# Patient Record
Sex: Female | Born: 1950 | Race: Black or African American | Hispanic: No | Marital: Married | Smoking: Never smoker
Health system: Southern US, Community
[De-identification: ages and names within clinical notes are randomized; demographics above are authoritative.]

## PROBLEM LIST (undated history)

## (undated) HISTORY — PX: ABDOMINAL HYSTERECTOMY: SHX81

---

## 2000-12-19 ENCOUNTER — Encounter: Payer: Self-pay | Admitting: Family Medicine

## 2005-10-06 ENCOUNTER — Ambulatory Visit: Payer: Self-pay | Admitting: Family Medicine

## 2005-10-28 ENCOUNTER — Encounter: Payer: Self-pay | Admitting: Family Medicine

## 2005-10-28 LAB — CONVERTED CEMR LAB
LDL Cholesterol: 87 mg/dL
Triglyceride fasting, serum: 57 mg/dL

## 2006-10-09 ENCOUNTER — Encounter: Payer: Self-pay | Admitting: Family Medicine

## 2006-10-10 ENCOUNTER — Ambulatory Visit: Payer: Self-pay | Admitting: Family Medicine

## 2006-11-29 ENCOUNTER — Encounter: Payer: Self-pay | Admitting: Family Medicine

## 2006-12-01 ENCOUNTER — Telehealth (INDEPENDENT_AMBULATORY_CARE_PROVIDER_SITE_OTHER): Payer: Self-pay | Admitting: *Deleted

## 2006-12-01 LAB — CONVERTED CEMR LAB
AST: 24 units/L (ref 0–37)
Albumin: 4.5 g/dL (ref 3.5–5.2)
Alkaline Phosphatase: 55 units/L (ref 39–117)
Bilirubin, Direct: 0.1 mg/dL (ref 0.0–0.3)
Total Bilirubin: 0.3 mg/dL (ref 0.3–1.2)

## 2006-12-05 ENCOUNTER — Encounter: Payer: Self-pay | Admitting: Family Medicine

## 2006-12-25 ENCOUNTER — Ambulatory Visit: Payer: Self-pay | Admitting: Family Medicine

## 2006-12-25 DIAGNOSIS — I10 Essential (primary) hypertension: Secondary | ICD-10-CM | POA: Insufficient documentation

## 2006-12-25 DIAGNOSIS — B351 Tinea unguium: Secondary | ICD-10-CM | POA: Insufficient documentation

## 2007-01-18 ENCOUNTER — Encounter: Payer: Self-pay | Admitting: Family Medicine

## 2008-02-27 ENCOUNTER — Ambulatory Visit: Payer: Self-pay | Admitting: Family Medicine

## 2008-02-29 ENCOUNTER — Encounter: Admission: RE | Admit: 2008-02-29 | Discharge: 2008-02-29 | Payer: Self-pay | Admitting: Family Medicine

## 2008-02-29 ENCOUNTER — Encounter: Payer: Self-pay | Admitting: Family Medicine

## 2008-03-03 LAB — CONVERTED CEMR LAB
ALT: 25 units/L (ref 0–35)
CO2: 22 meq/L (ref 19–32)
Calcium: 9.2 mg/dL (ref 8.4–10.5)
Chloride: 102 meq/L (ref 96–112)
Cholesterol: 191 mg/dL (ref 0–200)
Glucose, Bld: 96 mg/dL (ref 70–99)
Sodium: 138 meq/L (ref 135–145)
Total Bilirubin: 0.5 mg/dL (ref 0.3–1.2)
Total Protein: 7.2 g/dL (ref 6.0–8.3)
Triglycerides: 88 mg/dL (ref <150)
VLDL: 18 mg/dL (ref 0–40)

## 2008-03-04 ENCOUNTER — Encounter: Payer: Self-pay | Admitting: Family Medicine

## 2009-07-23 ENCOUNTER — Ambulatory Visit: Payer: Self-pay | Admitting: Family Medicine

## 2009-07-23 ENCOUNTER — Encounter: Payer: Self-pay | Admitting: Family Medicine

## 2009-07-23 ENCOUNTER — Other Ambulatory Visit: Admission: RE | Admit: 2009-07-23 | Discharge: 2009-07-23 | Payer: Self-pay | Admitting: Family Medicine

## 2009-07-23 DIAGNOSIS — K219 Gastro-esophageal reflux disease without esophagitis: Secondary | ICD-10-CM | POA: Insufficient documentation

## 2009-07-24 ENCOUNTER — Encounter: Payer: Self-pay | Admitting: Family Medicine

## 2009-07-27 ENCOUNTER — Encounter: Admission: RE | Admit: 2009-07-27 | Discharge: 2009-07-27 | Payer: Self-pay | Admitting: Family Medicine

## 2009-07-27 ENCOUNTER — Encounter: Payer: Self-pay | Admitting: Family Medicine

## 2009-07-27 LAB — CONVERTED CEMR LAB
ALT: 20 units/L (ref 0–35)
AST: 24 units/L (ref 0–37)
Albumin: 4.3 g/dL (ref 3.5–5.2)
BUN: 13 mg/dL (ref 6–23)
CO2: 26 meq/L (ref 19–32)
Calcium: 9.6 mg/dL (ref 8.4–10.5)
Chloride: 102 meq/L (ref 96–112)
Cholesterol: 215 mg/dL — ABNORMAL HIGH (ref 0–200)
Creatinine, Ser: 0.99 mg/dL (ref 0.40–1.20)
Glucose, Bld: 94 mg/dL (ref 70–99)
TSH: 1.457 microintl units/mL (ref 0.350–4.500)
Total Bilirubin: 0.5 mg/dL (ref 0.3–1.2)
Total Protein: 7.3 g/dL (ref 6.0–8.3)

## 2009-07-28 ENCOUNTER — Encounter: Admission: RE | Admit: 2009-07-28 | Discharge: 2009-07-28 | Payer: Self-pay | Admitting: Family Medicine

## 2009-07-29 LAB — HM MAMMOGRAPHY: HM Mammogram: NORMAL

## 2009-09-16 ENCOUNTER — Ambulatory Visit: Payer: Self-pay | Admitting: Family Medicine

## 2009-09-16 DIAGNOSIS — M542 Cervicalgia: Secondary | ICD-10-CM

## 2009-09-16 DIAGNOSIS — S335XXA Sprain of ligaments of lumbar spine, initial encounter: Secondary | ICD-10-CM

## 2009-09-23 ENCOUNTER — Encounter: Payer: Self-pay | Admitting: Family Medicine

## 2009-10-05 ENCOUNTER — Encounter: Admission: RE | Admit: 2009-10-05 | Discharge: 2009-11-10 | Payer: Self-pay | Admitting: Family Medicine

## 2009-10-05 ENCOUNTER — Encounter: Payer: Self-pay | Admitting: Family Medicine

## 2009-10-29 ENCOUNTER — Encounter: Payer: Self-pay | Admitting: Family Medicine

## 2010-06-18 ENCOUNTER — Ambulatory Visit: Payer: Self-pay | Admitting: Family Medicine

## 2010-06-18 DIAGNOSIS — K529 Noninfective gastroenteritis and colitis, unspecified: Secondary | ICD-10-CM

## 2010-06-19 ENCOUNTER — Encounter: Payer: Self-pay | Admitting: Family Medicine

## 2010-06-22 ENCOUNTER — Encounter: Payer: Self-pay | Admitting: Family Medicine

## 2010-06-22 ENCOUNTER — Telehealth: Payer: Self-pay | Admitting: Family Medicine

## 2010-06-22 LAB — CONVERTED CEMR LAB
Albumin: 4.8 g/dL (ref 3.5–5.2)
Alkaline Phosphatase: 55 units/L (ref 39–117)
BUN: 12 mg/dL (ref 6–23)
Basophils Absolute: 0 10*3/uL (ref 0.0–0.1)
Calcium: 10.4 mg/dL (ref 8.4–10.5)
Chloride: 97 meq/L (ref 96–112)
Creatinine, Ser: 0.88 mg/dL (ref 0.40–1.20)
Eosinophils Absolute: 0.1 10*3/uL (ref 0.0–0.7)
Eosinophils Relative: 1 % (ref 0–5)
Glucose, Bld: 60 mg/dL — ABNORMAL LOW (ref 70–99)
HCT: 37.1 % (ref 36.0–46.0)
Hemoglobin: 12.5 g/dL (ref 12.0–15.0)
Lymphocytes Relative: 35 % (ref 12–46)
MCV: 92.1 fL (ref 78.0–100.0)
Monocytes Absolute: 0.6 10*3/uL (ref 0.1–1.0)
Platelets: 399 10*3/uL (ref 150–400)
Potassium: 4 meq/L (ref 3.5–5.3)
RDW: 13.7 % (ref 11.5–15.5)

## 2010-06-28 ENCOUNTER — Telehealth (INDEPENDENT_AMBULATORY_CARE_PROVIDER_SITE_OTHER): Payer: Self-pay | Admitting: *Deleted

## 2010-07-05 ENCOUNTER — Ambulatory Visit: Payer: Self-pay | Admitting: Family Medicine

## 2010-07-05 DIAGNOSIS — R1013 Epigastric pain: Secondary | ICD-10-CM

## 2010-07-09 ENCOUNTER — Encounter: Payer: Self-pay | Admitting: Family Medicine

## 2010-07-28 LAB — HM COLONOSCOPY

## 2010-07-30 ENCOUNTER — Encounter: Payer: Self-pay | Admitting: Family Medicine

## 2010-07-31 ENCOUNTER — Encounter: Payer: Self-pay | Admitting: Family Medicine

## 2011-01-18 NOTE — Letter (Signed)
Summary: Letter with Path Results/[facility]  Letter with Path Results/[facility]   Imported By: Lanelle Bal 08/11/2010 10:39:36  _____________________________________________________________________  External Attachment:    Type:   Image     Comment:   External Document

## 2011-01-18 NOTE — Progress Notes (Signed)
Summary: needs a Rx.   Phone Note Call from Patient   Caller: Mom Summary of Call: pt came in and states that she was seen by Dr.Tommie Dejoseph last week and was given a Rx. for Cipro and dropped it in the toilet.. Please send another Rx. to her pharmacy.... [facility] in [place] on [address]  Call patient at [phone] Initial call taken by: Michaelle Copas,  June 22, 2010 11:51 AM    Prescriptions: CIPRO 500 MG TABS (CIPROFLOXACIN HCL) 1 tab by mouth q 12 hrs x 5 days  #10 x 0   Entered and Authorized by:   Seymour Bars DO   Signed by:   Seymour Bars DO on 06/22/2010   Method used:   Electronically to        [facility] [address] [phone]* (retail)       [address]       [place], [place]  [address]       Ph: [phone]       Fax: [phone]   RxID:   [id number]   Appended Document: needs a Rx.  pt aware rx efilled to [facility]. KIK

## 2011-01-18 NOTE — Progress Notes (Signed)
  Prescriptions: PROTONIX 40 MG TBEC (PANTOPRAZOLE SODIUM) 1 tab by mouth daily  #30 x 1   Entered by:   Payton Spark CMA   Authorized by:   Seymour Bars DO   Signed by:   Payton Spark CMA on 06/28/2010   Method used:   Electronically to        [facility] [address] [phone]* (retail)       [address]       [place], [place]  [address]       Ph: [phone]       Fax: [phone]   RxID:   [id number]

## 2011-01-18 NOTE — Assessment & Plan Note (Signed)
Summary: N/V/D   Vital Signs:  Patient profile:   60 year old female Menstrual status:  hysterectomy Height:      68.75 inches Weight:      158 pounds BMI:     23.59 Temp:     98.4 degrees F oral Pulse rate:   79 / minute BP sitting:   118 / 72  (left arm) Cuff size:   regular  Vitals Entered By: Kathlene November (June 18, 2010 10:45 AM) CC: yesterday vomited 3 times everytime she ate. Last week diarrhea- today feels better but has heartburn and abdominal bloating   Primary Care Provider:  Nani Gasser MD  CC:  yesterday vomited 3 times everytime she ate. Last week diarrhea- today feels better but has heartburn and abdominal bloating.  History of Present Illness: 60 yo AAF presents for vomitting and diarrhea that started 7 days ago.  She used Pepto and Immodium.  It started with vomitting at the start and at the end.  Feeling better today.  Has not seen any blood in her stools.  No sick contacts.  No fevers or chills.  Trying to hydrate.  She does feel hungry but has vomitting or diarrhea right after.  She is crampy and bloated.  Getting a lot of heartburn.  Having epigastric pain.  No new medications.  No recent antibiotics or traveling.  Current Medications (verified): 1)  Hydrochlorothiazide 25 Mg Tabs (Hydrochlorothiazide) .... One By Mouth Daily  Allergies (verified): No Known Drug Allergies  Past History:  Past Medical History: Reviewed history from 09/26/2006 and no changes required. _  Past Surgical History: Reviewed history from 09/26/2006 and no changes required. Partial Hysterectomy  1989  Family History: Reviewed history from 10/09/2006 and no changes required. alcoholism in brother and uncle MI in mother, GF  Social History: Reviewed history from 07/23/2009 and no changes required. 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  Walks occ for exercise.  2-3 glasses wine per day, no drugs, 1 caffeinated drink per  day.  Review of Systems      See HPI  Physical Exam  General:  alert, well-developed, well-nourished, and well-hydrated.   Head:  normocephalic and atraumatic.   Eyes:  sclera non icteric Mouth:  pharynx pink and moist.   Neck:  no masses.   Lungs:  Normal respiratory effort, chest expands symmetrically. Lungs are clear to auscultation, no crackles or wheezes. Heart:  Normal rate and regular rhythm. S1 and S2 normal without gallop, murmur, click, rub or other extra sounds. Abdomen:  Bowel sounds positive,abdomen soft and non-tender without masses, organomegaly  Extremities:  no LE edema Skin:  color normal.   Cervical Nodes:  No lymphadenopathy noted Psych:  good eye contact, not anxious appearing, and not depressed appearing.     Impression & Recommendations:  Problem # 1:  COLITIS ENTERIT&GASTROENTERIT INF ORIGIN (ICD-009.1) 7 days of N/V/D.  Hemodynamically stable, fairly well hydrated w/ normal exam findings.  Will get labs due to prolonged symptoms.  Will empirically treat for infectious colitis with 7 days of Cipro.  Treat hearturn with samples of Protonix x 10 days.  Cut HCTZ in 1/2 to prevent dehyration until V/D clears.  Clear fluids, advancing to [employer]  Check stool cx.  Call if not improved in 5-7 days.  Use Pepto Bismol for symptoms. Orders: T-CBC w/Diff ([id number]) T-Comprehensive Metabolic Panel [phone]) T-Stool Culture ([id number])  Complete Medication List: 1)  Hydrochlorothiazide 25 Mg Tabs (Hydrochlorothiazide) .Marland KitchenMarland KitchenMarland Kitchen  One by mouth daily 2)  Cipro 500 Mg Tabs (Ciprofloxacin hcl) .Marland Kitchen.. 1 tab by mouth q 12 hrs x 5 days 3)  Protonix 40 Mg Tbec (Pantoprazole sodium) .Marland Kitchen.. 1 tab by mouth daily  Patient Instructions: 1)  Take Cipro 1 pill every 12 hrs x 5 days for presumed infectious colitis. 2)  Use PEPTO BISMOL for remaining diarrhea/ nausea symptoms. 3)  Stick to a bland diet, clear fluids mostly until symptoms resolve.  Avoid milk products for the next 2  wks. 4)  Use Protonix for heartburn 1 tab daily until samples run out. 5)  Labs today. 6)  Will call you w/ results on Tuesday. 7)  If not resolved in 7 days, pls call. Prescriptions: CIPRO 500 MG TABS (CIPROFLOXACIN HCL) 1 tab by mouth q 12 hrs x 5 days  #10 x 0   Entered and Authorized by:   Seymour Bars DO   Signed by:   Seymour Bars DO on 06/18/2010   Method used:   Electronically to        [facility] [address] [phone]* (retail)       [address]       [place], [place]  [address]       Ph: [phone]       Fax: [phone]   RxID:   [id number]

## 2011-01-18 NOTE — Consult Note (Signed)
Summary: [facility]  [facility]   Imported By: Lanelle Bal 07/15/2010 11:15:50  _____________________________________________________________________  External Attachment:    Type:   Image     Comment:   External Document

## 2011-01-18 NOTE — Miscellaneous (Signed)
Summary: EGD/ colonoscopy  Clinical Lists Changes  Observations: Added new observation of EGD: [facility] Findings: Normal   (07/28/2010 14:43) Added new observation of COLONRECACT: Further recommendations pending biopsy results.   (07/28/2010 14:42) Added new observation of COLONOSCOPY: Location:  [facility].    Polyps in the sigmoid colon (polypectomy) polyp in the rectum (polypectomy) Internal hemorrhoids Diverticulosis of the Sigmoid Colon O/w normal  (07/28/2010 14:42)      Colonoscopy  Procedure date:  07/28/2010  Findings:      Location:  [facility].    Polyps in the sigmoid colon (polypectomy) polyp in the rectum (polypectomy) Internal hemorrhoids Diverticulosis of the Sigmoid Colon O/w normal   Comments:      Further recommendations pending biopsy results.    EGD  Procedure date:  07/28/2010  Findings:      [facility] Findings: Normal     Colonoscopy  Procedure date:  07/28/2010  Findings:      Location:  [facility].    Polyps in the sigmoid colon (polypectomy) polyp in the rectum (polypectomy) Internal hemorrhoids Diverticulosis of the Sigmoid Colon O/w normal   Comments:      Further recommendations pending biopsy results.    EGD  Procedure date:  07/28/2010  Findings:      [facility] Findings: Normal

## 2011-01-18 NOTE — Assessment & Plan Note (Signed)
Summary: epigastric pain   Vital Signs:  Patient profile:   60 year old female Menstrual status:  hysterectomy Height:      68.75 inches Weight:      159 pounds BMI:     23.74 O2 Sat:      99 % on Room air Pulse rate:   79 / minute BP sitting:   126 / 71  (left arm) Cuff size:   regular  Vitals Entered By: Payton Spark CMA (July 05, 2010 9:18 AM)  O2 Flow:  Room air CC: F/U. Feeling better but still having tenderness.   Primary Care Provider:  Nani Gasser MD  CC:  F/U. Feeling better but still having tenderness.Marland Kitchen  History of Present Illness: 60 yo AAF presents for f/u infectious colitis from 7/1 after having 7 days of N/V/D.  We went ahead and treated her for infectious colitis with Cipro for 7 days.  She is also taking Protonix daily for dyspepsia.  She is about 80% better.  Has some mild epigastric tenderness, not worse after eating, constant.  Her appetite is back to normal.    Her stool cx's were neg.  Denies melena, hematochezia, constipation or diarrhea.  Denies NSAID or ETOH use.  Had H. Pylori treated last year. CBC and CMP were normal.    She has not had a colonoscopy.   Current Medications (verified): 1)  Hydrochlorothiazide 25 Mg Tabs (Hydrochlorothiazide) .... One By Mouth Daily 2)  Protonix 40 Mg Tbec (Pantoprazole Sodium) .Marland Kitchen.. 1 Tab By Mouth Daily  Allergies (verified): No Known Drug Allergies  Past History:  Past Medical History: Reviewed history from 09/26/2006 and no changes required. _  Past Surgical History: Reviewed history from 09/26/2006 and no changes required. Partial Hysterectomy  1989  Social History: Reviewed history from 07/23/2009 and no changes required. 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  Walks occ for exercise.  2-3 glasses wine per day, no drugs, 1 caffeinated drink per day.  Review of Systems      See HPI  Physical Exam  General:  alert, well-developed, well-nourished, and  well-hydrated.   Head:  normocephalic and atraumatic.   Eyes:  sclera non icteric; wears glasses Mouth:  pharynx pink and moist.   Neck:  no masses.   Lungs:  Normal respiratory effort, chest expands symmetrically. Lungs are clear to auscultation, no crackles or wheezes. Heart:  Normal rate and regular rhythm. S1 and S2 normal without gallop, murmur, click, rub or other extra sounds. Abdomen:  slight epigastric TTP w/o vol guarding.  soft, no distention, no hepatomegaly, and no splenomegaly.   Extremities:  no LE edema Skin:  color normal.  no jaundice or pallor Cervical Nodes:  No lymphadenopathy noted Psych:  good eye contact, not anxious appearing, and not depressed appearing.     Impression & Recommendations:  Problem # 1:  ABDOMINAL PAIN, EPIGASTRIC (ICD-789.06) N/V/D did improve after treatment for prolonged gastroenteritis with 7 days of Cipro.  Her labs and stool cx were normal.  She has improved but still has epigastric tenderness which is constant, even on Protonix daily.  Will continue reflux precautions as well as daily use of Protonix and will refer to [facility] for eval and tx -- needs EGD + colonoscopy.  Call if any worsening of pain or blood in the stool.   Orders: Gastroenterology Referral (GI)  Problem # 2:  HYPERTENSION (ICD-401.1)  No longer dehydrated.  Back on full dose  HCTZ and BP looks great. Her updated medication list for this problem includes:    Hydrochlorothiazide 25 Mg Tabs (Hydrochlorothiazide) ..... One by mouth daily  BP today: 126/71 Prior BP: 118/72 (06/18/2010)  Prior 10 Yr Risk Heart Disease: 4 % (02/27/2008)  Labs Reviewed: K+: 4.0 (06/19/2010) Creat: : 0.88 (06/19/2010)   Chol: 215 (07/24/2009)   HDL: 98 (07/24/2009)   LDL: 101 (07/24/2009)   TG: 80 (07/24/2009)  Complete Medication List: 1)  Hydrochlorothiazide 25 Mg Tabs (Hydrochlorothiazide) .... One by mouth daily 2)  Protonix 40 Mg Tbec (Pantoprazole sodium) .Marland Kitchen.. 1  tab by mouth daily  Patient Instructions: 1)  Stay on Protonix. 2)  Avoid any anti- inflammatories or Alcohol. 3)  Will get you into [facility].

## 2011-07-12 ENCOUNTER — Other Ambulatory Visit: Payer: Self-pay | Admitting: Family Medicine

## 2011-07-15 ENCOUNTER — Other Ambulatory Visit: Payer: Self-pay | Admitting: Family Medicine

## 2011-07-15 NOTE — Telephone Encounter (Signed)
Pt needs office visit within 30 days for her BP.

## 2011-08-17 ENCOUNTER — Other Ambulatory Visit: Payer: Self-pay | Admitting: Family Medicine

## 2011-10-13 ENCOUNTER — Encounter: Payer: Self-pay | Admitting: Emergency Medicine

## 2011-10-13 ENCOUNTER — Telehealth: Payer: Self-pay | Admitting: Family Medicine

## 2011-10-13 ENCOUNTER — Ambulatory Visit
Admission: RE | Admit: 2011-10-13 | Discharge: 2011-10-13 | Disposition: A | Discharge status: Home / Self Care | Payer: BC Managed Care – PPO | Source: Ambulatory Visit | Attending: Emergency Medicine | Admitting: Emergency Medicine

## 2011-10-13 ENCOUNTER — Inpatient Hospital Stay (INDEPENDENT_AMBULATORY_CARE_PROVIDER_SITE_OTHER)
Admission: RE | Admit: 2011-10-13 | Discharge: 2011-10-13 | Disposition: A | Discharge status: Home / Self Care | Payer: BC Managed Care – PPO | Source: Ambulatory Visit | Attending: Emergency Medicine | Admitting: Emergency Medicine

## 2011-10-13 ENCOUNTER — Other Ambulatory Visit: Payer: Self-pay | Admitting: Emergency Medicine

## 2011-10-13 DIAGNOSIS — M545 Low back pain, unspecified: Secondary | ICD-10-CM

## 2011-10-13 DIAGNOSIS — M48061 Spinal stenosis, lumbar region without neurogenic claudication: Secondary | ICD-10-CM
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
## 2011-10-16 ENCOUNTER — Encounter: Payer: Self-pay | Admitting: Family Medicine

## 2011-10-17 ENCOUNTER — Encounter: Payer: Self-pay | Admitting: Family Medicine

## 2011-10-17 ENCOUNTER — Ambulatory Visit (INDEPENDENT_AMBULATORY_CARE_PROVIDER_SITE_OTHER): Payer: BC Managed Care – PPO | Admitting: Family Medicine

## 2011-10-17 VITALS — BP 140/71 | HR 89 | Ht 69.5 in | Wt 162.0 lb

## 2011-10-17 DIAGNOSIS — E785 Hyperlipidemia, unspecified: Secondary | ICD-10-CM

## 2011-10-17 DIAGNOSIS — M545 Low back pain: Secondary | ICD-10-CM

## 2011-10-17 DIAGNOSIS — Z2911 Encounter for prophylactic immunotherapy for respiratory syncytial virus (RSV): Secondary | ICD-10-CM

## 2011-10-17 DIAGNOSIS — I10 Essential (primary) hypertension: Secondary | ICD-10-CM

## 2011-10-17 DIAGNOSIS — Z1231 Encounter for screening mammogram for malignant neoplasm of breast: Secondary | ICD-10-CM

## 2011-10-17 DIAGNOSIS — Z23 Encounter for immunization: Secondary | ICD-10-CM
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
## 2011-10-17 NOTE — Patient Instructions (Signed)
Google the DASH diet (.http://www.myers.net/).  PDF that you have to download.    1.5 Gram Low Sodium Diet A 1.5 gram sodium diet restricts the amount of sodium in the diet to no more than 1.5 g or 1500 mg daily. The American Heart Association recommends Americans over the age of 46 to consume no more than 1500 mg of sodium each day to reduce the risk of developing high blood pressure. Research also shows that limiting sodium may reduce heart attack and stroke risk. Many foods contain sodium for flavor and sometimes as a preservative. When the amount of sodium in a diet needs to be low, it is important to know what to look for when choosing foods and drinks. The following includes some information and guidelines to help make it easier for you to adapt to a low sodium diet. QUICK TIPS  Do not add salt to food.     Avoid convenience items and fast food.     Choose unsalted snack foods.     Buy lower sodium products, often labeled as "lower sodium" or "no salt added."     Check food labels to learn how much sodium is in 1 serving.     When eating at a restaurant, ask that your food be prepared with less salt or none, if possible.  READING FOOD LABELS FOR SODIUM INFORMATION The nutrition facts label is a good place to find how much sodium is in foods. Look for products with no more than 400 mg of sodium per serving. Remember that 1.5 g = 1500 mg. The food label may also list foods as:  Sodium-free: Less than 5 mg in a serving.     Very low sodium: 35 mg or less in a serving.     Low-sodium: 140 mg or less in a serving.     Light in sodium: 50% less sodium in a serving. For example, if a food that usually has 300 mg of sodium is changed to become light in sodium, it will have 150 mg of sodium.     Reduced sodium: 25% less sodium in a serving. For example, if a food that usually has 400 mg of sodium is changed to reduced sodium, it will have 300 mg of sodium.  CHOOSING FOODS Grains  Avoid: Salted  crackers and snack items. Some cereals, including instant hot cereals. Bread stuffing and biscuit mixes. Seasoned rice or pasta mixes.     Choose: Unsalted snack items. Low-sodium cereals, oats, puffed wheat and rice, shredded wheat. English muffins and bread. Pasta.  Meats  Avoid: Salted, canned, smoked, spiced, pickled meats, including fish and poultry. Bacon, ham, sausage, cold cuts, hot dogs, anchovies.     Choose: Low-sodium canned tuna and salmon. Fresh or frozen meat, poultry, and fish.  Dairy  Avoid: Processed cheese and spreads. Cottage cheese. Buttermilk and condensed milk. Regular cheese.     Choose: Milk. Low-sodium cottage cheese. Yogurt. Sour cream. Low-sodium cheese.  Fruits and Vegetables  Avoid: Regular canned vegetables. Regular canned tomato sauce and paste. Frozen vegetables in sauces. Olives. Rosita Fire. Relishes. Sauerkraut.     Choose: Low-sodium canned vegetables. Low-sodium tomato sauce and paste. Frozen or fresh vegetables. Fresh and frozen fruit.  Condiments  Avoid: Canned and packaged gravies. Worcestershire sauce. Tartar sauce. Barbecue sauce. Soy sauce. Steak sauce. Ketchup. Onion, garlic, and table salt. Meat flavorings and tenderizers.     Choose: Fresh and dried herbs and spices. Low-sodium varieties of mustard and ketchup. Lemon juice. Tabasco sauce.  Horseradish.  SAMPLE 1.5 GRAM SODIUM MEAL PLAN Breakfast / Sodium (mg)  1 cup low-fat milk / 143 mg     1 whole-wheat English muffin / 240 mg     1 tbs heart-healthy margarine / 153 mg     1 hard-boiled egg / 139 mg     1 small orange / 0 mg  Lunch / Sodium (mg)  1 cup raw carrots / 76 mg     2 tbs no salt added peanut butter / 5 mg     2 slices whole-wheat bread / 270 mg     1 tbs jelly / 6 mg      cup red grapes / 2 mg  Dinner / Sodium (mg)  1 cup whole-wheat pasta / 2 mg     1 cup low-sodium tomato sauce / 73 mg     3 oz lean ground beef / 57 mg     1 small side salad (1 cup raw  spinach leaves,  cup cucumber,  cup yellow bell pepper) with 1 tsp olive oil and 1 tsp red wine vinegar / 25 mg  Snack / Sodium (mg)  1 container low-fat vanilla yogurt / 107 mg     3 graham cracker squares / 127 mg  Nutrient Analysis  Calories: 1745     Protein: 75 g     Carbohydrate: 237 g     Fat: 57 g     Sodium: 1425 mg  Document Released: 12/05/2005 Document Revised: 08/17/2011 Document Reviewed: 03/08/2010 [facility] Patient Information 2012 [place], [place].

## 2011-10-18 ENCOUNTER — Ambulatory Visit: Payer: BC Managed Care – PPO

## 2011-10-19 ENCOUNTER — Ambulatory Visit: Payer: BC Managed Care – PPO | Attending: Emergency Medicine | Admitting: Physical Therapy

## 2011-10-19 DIAGNOSIS — M2550 Pain in unspecified joint: Secondary | ICD-10-CM | POA: Insufficient documentation

## 2011-10-19 DIAGNOSIS — M6281 Muscle weakness (generalized): Secondary | ICD-10-CM | POA: Insufficient documentation

## 2011-10-19 DIAGNOSIS — IMO0001 Reserved for inherently not codable concepts without codable children: Secondary | ICD-10-CM | POA: Insufficient documentation

## 2011-10-19 DIAGNOSIS — R293 Abnormal posture: Secondary | ICD-10-CM | POA: Insufficient documentation

## 2011-10-24 ENCOUNTER — Ambulatory Visit: Payer: BC Managed Care – PPO | Attending: Emergency Medicine | Admitting: Physical Therapy

## 2011-10-24 DIAGNOSIS — IMO0001 Reserved for inherently not codable concepts without codable children: Secondary | ICD-10-CM | POA: Insufficient documentation

## 2011-10-24 DIAGNOSIS — M6281 Muscle weakness (generalized): Secondary | ICD-10-CM | POA: Insufficient documentation

## 2011-10-24 DIAGNOSIS — M2550 Pain in unspecified joint: Secondary | ICD-10-CM | POA: Insufficient documentation

## 2011-10-24 DIAGNOSIS — R293 Abnormal posture: Secondary | ICD-10-CM | POA: Insufficient documentation

## 2011-10-25 ENCOUNTER — Ambulatory Visit
Admission: RE | Admit: 2011-10-25 | Discharge: 2011-10-25 | Disposition: A | Discharge status: Home / Self Care | Payer: BC Managed Care – PPO | Source: Ambulatory Visit | Attending: Family Medicine | Admitting: Family Medicine

## 2011-10-25 DIAGNOSIS — Z1231 Encounter for screening mammogram for malignant neoplasm of breast: Secondary | ICD-10-CM

## 2011-10-27 ENCOUNTER — Ambulatory Visit: Payer: BC Managed Care – PPO | Admitting: Physical Therapy

## 2011-10-31 ENCOUNTER — Ambulatory Visit: Payer: BC Managed Care – PPO | Admitting: Physical Therapy

## 2011-11-03 ENCOUNTER — Encounter: Payer: BC Managed Care – PPO | Admitting: Physical Therapy

## 2011-11-07 ENCOUNTER — Ambulatory Visit: Payer: BC Managed Care – PPO | Admitting: Physical Therapy

## 2011-11-09 ENCOUNTER — Ambulatory Visit: Payer: BC Managed Care – PPO | Admitting: Physical Therapy

## 2011-11-14 ENCOUNTER — Ambulatory Visit: Payer: BC Managed Care – PPO | Admitting: Physical Therapy

## 2011-11-17 ENCOUNTER — Ambulatory Visit: Payer: BC Managed Care – PPO | Admitting: Physical Therapy

## 2011-11-21 ENCOUNTER — Other Ambulatory Visit: Payer: Self-pay | Admitting: Family Medicine
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
## 2012-03-02 ENCOUNTER — Other Ambulatory Visit: Payer: Self-pay | Admitting: Family Medicine

## 2012-04-30 ENCOUNTER — Other Ambulatory Visit: Payer: Self-pay | Admitting: Family Medicine

## 2012-05-29 ENCOUNTER — Other Ambulatory Visit: Payer: Self-pay | Admitting: Family Medicine

## 2012-06-25 ENCOUNTER — Other Ambulatory Visit: Payer: Self-pay | Admitting: Family Medicine

## 2012-06-25 NOTE — Telephone Encounter (Signed)
Needs appt before future refills

## 2012-07-26 ENCOUNTER — Other Ambulatory Visit: Payer: Self-pay | Admitting: Family Medicine

## 2012-07-30 ENCOUNTER — Ambulatory Visit: Payer: BC Managed Care – PPO | Admitting: Family Medicine

## 2012-08-03 ENCOUNTER — Ambulatory Visit (INDEPENDENT_AMBULATORY_CARE_PROVIDER_SITE_OTHER): Payer: BC Managed Care – PPO | Admitting: Family Medicine

## 2012-08-03 ENCOUNTER — Encounter: Payer: Self-pay | Admitting: Family Medicine

## 2012-08-03 VITALS — BP 150/77 | HR 84 | Ht 69.5 in | Wt 158.0 lb

## 2012-08-03 DIAGNOSIS — I10 Essential (primary) hypertension: Secondary | ICD-10-CM

## 2012-08-03 DIAGNOSIS — M545 Low back pain, unspecified: Secondary | ICD-10-CM

## 2012-08-03 DIAGNOSIS — E785 Hyperlipidemia, unspecified: Secondary | ICD-10-CM

## 2012-08-03 MED ORDER — TRAMADOL HCL 50 MG PO TABS: 50.0000 mg | ORAL_TABLET | Freq: Three times a day (TID) | ORAL | Status: DC | PRN

## 2012-08-03 MED ORDER — HYDROCHLOROTHIAZIDE 25 MG PO TABS: 25.0000 mg | ORAL_TABLET | Freq: Every day | ORAL | Status: DC

## 2012-08-03 NOTE — Progress Notes (Signed)
  Subjective:    Patient ID: Carolyn Lane, female    DOB: 06-04-1951, 61 y.o.   MRN: [id number]  HPI HTN - Taking meds regulalry. Has lost 4 lbs. Eatting a low salt diet. Not as physically. Doesn't check BP at home. Has been a year since had labs.  Ran out of BP med today. No CP or SOB.    Review of Systems     Objective:   Physical Exam  Constitutional: She is oriented to person, place, and time. She appears well-developed and well-nourished.  HENT:  Head: Normocephalic and atraumatic.  Cardiovascular: Normal rate, regular rhythm and normal heart sounds.   Pulmonary/Chest: Effort normal and breath sounds normal.  Neurological: She is alert and oriented to person, place, and time.  Skin: Skin is warm and dry.  Psychiatric: She has a normal mood and affect. Her behavior is normal.          Assessment & Plan:  HTN- Recheck in one week to confirm.  IF still high ocnsider adding an ACEi. Due for CMP and fasting lipid panel. Continue work on exercise and low salt diet.  Hyperlipidemia - Due to recheck.   Chronic-recurrent low back pain-I. did refill her tramadol today.

## 2012-08-03 NOTE — Patient Instructions (Addendum)
Black Cohash, evening primerose oil Increase soy in your diet.   Valerian root capsules to help with sleep.

## 2012-08-06 LAB — COMPLETE METABOLIC PANEL WITH GFR
ALT: 20 U/L (ref 0–35)
AST: 23 U/L (ref 0–37)
Creat: 0.92 mg/dL (ref 0.50–1.10)
Sodium: 139 mEq/L (ref 135–145)
Total Bilirubin: 0.5 mg/dL (ref 0.3–1.2)

## 2012-08-06 LAB — LIPID PANEL
HDL: 100 mg/dL (ref >39)
LDL Cholesterol: 113 mg/dL — ABNORMAL HIGH (ref 0–99)
Total CHOL/HDL Ratio: 2.3 Ratio
Triglycerides: 104 mg/dL (ref <150)
VLDL: 21 mg/dL (ref 0–40)

## 2012-08-10 ENCOUNTER — Ambulatory Visit (INDEPENDENT_AMBULATORY_CARE_PROVIDER_SITE_OTHER): Payer: BC Managed Care – PPO | Admitting: Family Medicine

## 2012-08-10 VITALS — BP 134/80 | HR 74

## 2012-08-10 DIAGNOSIS — I10 Essential (primary) hypertension: Secondary | ICD-10-CM

## 2012-08-10 NOTE — Progress Notes (Addendum)
  Subjective:    Patient ID: Carolyn Lane, female    DOB: 04/25/1951, 61 y.o.   MRN: [id number]  HPI    Pt denies chest pain, SOB, dizziness, or heart palpitations.  Taking meds as directed w/o problems.  Denies medication side effects.  5 min spent with pt.  Review of Systems     Objective:   Physical Exam        Assessment & Plan:  HTN- blood pressure looks much better this time. At goal. Followup in 6 months with me for blood pressure. Nani Gasser, MD

## 2012-08-30 ENCOUNTER — Other Ambulatory Visit: Payer: Self-pay | Admitting: Family Medicine

## 2012-10-26 ENCOUNTER — Other Ambulatory Visit: Payer: Self-pay | Admitting: Family Medicine

## 2012-10-30 ENCOUNTER — Other Ambulatory Visit: Payer: Self-pay | Admitting: Family Medicine

## 2012-12-25 ENCOUNTER — Other Ambulatory Visit: Payer: Self-pay | Admitting: Family Medicine

## 2013-01-25 ENCOUNTER — Other Ambulatory Visit: Payer: Self-pay | Admitting: Family Medicine

## 2013-01-25 NOTE — Telephone Encounter (Signed)
Needs f/u before future refills  

## 2013-02-28 ENCOUNTER — Other Ambulatory Visit: Payer: Self-pay | Admitting: Family Medicine

## 2013-03-04 ENCOUNTER — Encounter: Payer: Self-pay | Admitting: Family Medicine

## 2013-03-04 ENCOUNTER — Ambulatory Visit (INDEPENDENT_AMBULATORY_CARE_PROVIDER_SITE_OTHER): Payer: BC Managed Care – PPO | Admitting: Family Medicine

## 2013-03-04 VITALS — BP 135/68 | HR 97 | Ht 69.6 in | Wt 156.0 lb

## 2013-03-04 DIAGNOSIS — E785 Hyperlipidemia, unspecified: Secondary | ICD-10-CM

## 2013-03-04 DIAGNOSIS — I10 Essential (primary) hypertension: Secondary | ICD-10-CM

## 2013-03-04 MED ORDER — HYDROCHLOROTHIAZIDE 25 MG PO TABS: 25.0000 mg | ORAL_TABLET | Freq: Every day | ORAL | Status: DC

## 2013-03-04 NOTE — Progress Notes (Signed)
  Subjective:    Patient ID: Carolyn Lane, female    DOB: June 19, 1951, 62 y.o.   MRN: [id number]  HPI HTN-  Pt denies chest pain, SOB, dizziness, or heart palpitations.  Taking meds as directed w/o problems.  Denies medication side effects.  EAting very healthy and walking for exercise.     Review of Systems     Objective:   Physical Exam  Constitutional: She is oriented to person, place, and time. She appears well-developed and well-nourished.  HENT:  Head: Normocephalic and atraumatic.  Cardiovascular: Normal rate, regular rhythm and normal heart sounds.   Pulmonary/Chest: Effort normal and breath sounds normal.  Neurological: She is alert and oriented to person, place, and time.  Skin: Skin is warm and dry.  Psychiatric: She has a normal mood and affect. Her behavior is normal.          Assessment & Plan:  HTN - well controlled.  F/U in 6 months. Due for CMp and lipids.    Hyperlipidemia-her cholesterol is slightly elevated back in August. She says she's really make some major dietary changes and exercise and to get this back under control and would like to go ahead and recheck them this week. Lab slip given. Lab Results  Component Value Date   CHOL 234* 08/06/2012   HDL 100 08/06/2012   LDLCALC 113* 08/06/2012   TRIG 104 08/06/2012   CHOLHDL 2.3 08/06/2012

## 2013-03-08 LAB — COMPLETE METABOLIC PANEL WITH GFR
Albumin: 4.2 g/dL (ref 3.5–5.2)
CO2: 33 mEq/L — ABNORMAL HIGH (ref 19–32)
Calcium: 9.4 mg/dL (ref 8.4–10.5)
GFR, Est African American: 85 mL/min
GFR, Est Non African American: 74 mL/min
Glucose, Bld: 95 mg/dL (ref 70–99)
Potassium: 4 mEq/L (ref 3.5–5.3)
Sodium: 143 mEq/L (ref 135–145)
Total Bilirubin: 0.4 mg/dL (ref 0.3–1.2)
Total Protein: 6.8 g/dL (ref 6.0–8.3)

## 2013-10-24 ENCOUNTER — Other Ambulatory Visit: Payer: Self-pay

## 2014-04-12 ENCOUNTER — Other Ambulatory Visit: Payer: Self-pay | Admitting: Family Medicine

## 2014-04-15 ENCOUNTER — Other Ambulatory Visit: Payer: Self-pay | Admitting: Family Medicine

## 2014-05-22 ENCOUNTER — Other Ambulatory Visit: Payer: Self-pay | Admitting: Family Medicine

## 2014-05-27 ENCOUNTER — Other Ambulatory Visit: Payer: Self-pay | Admitting: Family Medicine

## 2014-05-28 ENCOUNTER — Telehealth: Payer: Self-pay

## 2014-05-28 MED ORDER — HYDROCHLOROTHIAZIDE 25 MG PO TABS: 25.0000 mg | ORAL_TABLET | Freq: Every day | ORAL | Status: DC

## 2014-05-28 NOTE — Telephone Encounter (Signed)
Refilled HCTZ and advised to schedule next appointment in 3 months.

## 2014-09-03 ENCOUNTER — Encounter: Payer: Self-pay | Admitting: Family Medicine

## 2014-09-03 ENCOUNTER — Ambulatory Visit (INDEPENDENT_AMBULATORY_CARE_PROVIDER_SITE_OTHER): Payer: BC Managed Care – PPO | Admitting: Family Medicine

## 2014-09-03 VITALS — BP 128/72 | HR 74 | Ht 69.6 in | Wt 145.0 lb

## 2014-09-03 DIAGNOSIS — L259 Unspecified contact dermatitis, unspecified cause: Secondary | ICD-10-CM | POA: Diagnosis not present

## 2014-09-03 DIAGNOSIS — I10 Essential (primary) hypertension: Secondary | ICD-10-CM

## 2014-09-03 DIAGNOSIS — Z23 Encounter for immunization: Secondary | ICD-10-CM | POA: Diagnosis not present

## 2014-09-03 DIAGNOSIS — Z0000 Encounter for general adult medical examination without abnormal findings: Secondary | ICD-10-CM | POA: Diagnosis not present

## 2014-09-03 DIAGNOSIS — Z1231 Encounter for screening mammogram for malignant neoplasm of breast: Secondary | ICD-10-CM | POA: Diagnosis not present

## 2014-09-03 DIAGNOSIS — L309 Dermatitis, unspecified: Secondary | ICD-10-CM

## 2014-09-03 LAB — COMPLETE METABOLIC PANEL WITH GFR
ALK PHOS: 54 U/L (ref 39–117)
ALT: 18 U/L (ref 0–35)
AST: 25 U/L (ref 0–37)
Albumin: 4.7 g/dL (ref 3.5–5.2)
BILIRUBIN TOTAL: 0.5 mg/dL (ref 0.2–1.2)
BUN: 12 mg/dL (ref 6–23)
CALCIUM: 10 mg/dL (ref 8.4–10.5)
CO2: 29 mEq/L (ref 19–32)
CREATININE: 0.8 mg/dL (ref 0.50–1.10)
Chloride: 101 mEq/L (ref 96–112)
GFR, Est African American: >89 mL/min
GFR, Est Non African American: 78 mL/min
Glucose, Bld: 81 mg/dL (ref 70–99)
Potassium: 4.5 mEq/L (ref 3.5–5.3)
Sodium: 139 mEq/L (ref 135–145)
Total Protein: 7.1 g/dL (ref 6.0–8.3)

## 2014-09-03 LAB — LIPID PANEL
CHOL/HDL RATIO: 1.9 ratio
CHOLESTEROL: 218 mg/dL — AB (ref 0–200)
HDL: 114 mg/dL (ref >39)
LDL Cholesterol: 88 mg/dL (ref 0–99)
TRIGLYCERIDES: 82 mg/dL (ref <150)
VLDL: 16 mg/dL (ref 0–40)

## 2014-09-03 MED ORDER — HYDROCHLOROTHIAZIDE 25 MG PO TABS: 25.0000 mg | ORAL_TABLET | Freq: Every day | ORAL | Status: DC

## 2014-09-03 MED ORDER — TRIAMCINOLONE ACETONIDE 0.025 % EX CREA: 1.0000 "application " | TOPICAL_CREAM | Freq: Two times a day (BID) | CUTANEOUS | Status: DC

## 2014-09-03 MED ORDER — TRAMADOL HCL 50 MG PO TABS: 50.0000 mg | ORAL_TABLET | Freq: Three times a day (TID) | ORAL | Status: DC | PRN

## 2014-09-03 NOTE — Progress Notes (Signed)
Subjective:     Carolyn Lane is a 63 y.o. female and is here for a comprehensive physical exam. The patient reports problems - not walking as much after he dog died.    Hypertension- Pt denies chest pain, SOB, dizziness, or heart palpitations.  Taking meds as directed w/o problems.  Denies medication side effects.  Home BPs usually in the 130s.   She's also concerned about a rash near the edge of her bottom lip. She says his been coming and going over the summer. She says it will get a little scaly and irritated and then it'll often look little better. If it has not been itchy or painful. She did start using a new lip balm over the summer.  History   Social History  . Marital Status: Married    Spouse Name: N/A    Number of Children: N/A  . Years of Education: N/A   Occupational History  . Not on file.   Social History Main Topics  . Smoking status: Never Smoker   . Smokeless tobacco: Not on file  . Alcohol Use: 1.8 oz/week    3 Glasses of wine per week     Comment: per day  . Drug Use: No  . Sexual Activity: Not on file     Comment: substitute teacher, MA degree, married, 2 children, walks occ for exercise, 1 caffeine a day.    Other Topics Concern  . Not on file   Social History Narrative  . No narrative on file   Health Maintenance  Topic Date Due  . Mammogram  10/24/2013  . Influenza Vaccine  07/19/2014  . Tetanus/tdap  10/10/2016  . Colonoscopy  07/28/2020  . Zostavax  Completed    The following portions of the patient's history were reviewed and updated as appropriate: allergies, current medications, past family history, past medical history, past social history, past surgical history and problem list.  Review of Systems A comprehensive review of systems was negative.   Objective:    BP 151/83  Pulse 74  Ht 5' 9.6" (1.768 m)  Wt 145 lb (65.772 kg)  BMI 21.04 kg/m2 General appearance: alert, cooperative and appears stated age Head: Normocephalic,  without obvious abnormality, atraumatic Eyes: conj clear, EOMi, PEERLA Ears: normal TM's and external ear canals both ears Nose: Nares normal. Septum midline. Mucosa normal. No drainage or sinus tenderness. Throat: lips, mucosa, and tongue normal; teeth and gums normal Neck: no adenopathy, no carotid bruit, no JVD, supple, symmetrical, trachea midline and thyroid not enlarged, symmetric, no tenderness/mass/nodules Back: symmetric, no curvature. ROM normal. No CVA tenderness. Lungs: clear to auscultation bilaterally Breasts: normal appearance, no masses or tenderness Heart: regular rate and rhythm, S1, S2 normal, no murmur, click, rub or gallop Abdomen: soft, non-tender; bowel sounds normal; no masses,  no organomegaly Extremities: extremities normal, atraumatic, no cyanosis or edema Pulses: 2+ and symmetric Skin: Skin color, texture, turgor normal. No rashes or lesions Lymph nodes: Cervical, supraclavicular, and axillary nodes normal. Neurologic: Alert and oriented X 3, normal strength and tone. Normal symmetric reflexes. Normal coordination and gait    Assessment:    Healthy female exam.      Plan:     See After Visit Summary for Counseling Recommendations  Keep up a regular exercise program and make sure you are eating a healthy diet Try to eat 4 servings of dairy a day, or if you are lactose intolerant take a calcium with vitamin D daily.  Your vaccines are up  to date.   Will schedule mammogram. Last was in 2012.  Oral dermatitis-will treat with low-dose topical steroid cream. If not improving in the next couple weeks and please let me know.  Flu vaccine given today.

## 2014-09-03 NOTE — Assessment & Plan Note (Signed)
Repeat blood pressure looks great. Well-controlled. Continue current regimen. Followup in 6 months.

## 2014-09-03 NOTE — Patient Instructions (Signed)
Keep up a regular exercise program and make sure you are eating a healthy diet Try to eat 4 servings of dairy a day, or if you are lactose intolerant take a calcium with vitamin D daily.  Your vaccines are up to date.   

## 2014-10-22 ENCOUNTER — Ambulatory Visit (INDEPENDENT_AMBULATORY_CARE_PROVIDER_SITE_OTHER): Payer: BC Managed Care – PPO

## 2014-10-22 DIAGNOSIS — Z1231 Encounter for screening mammogram for malignant neoplasm of breast: Secondary | ICD-10-CM

## 2015-01-27 ENCOUNTER — Telehealth: Payer: Self-pay | Admitting: *Deleted

## 2015-01-27 NOTE — Telephone Encounter (Signed)
Pt wanted to know if Dr. Linford ArnoldMetheney has any recommendations on a good female provider for her husband.  Will fwd for advice.Loralee PacasBarkley, Keyonta Barradas [place]

## 2015-01-28 NOTE — Telephone Encounter (Signed)
I would recommend Dr. Ivan AnchorsHommel here. Is he looking in another locatioN THEN LET ME KNOW

## 2015-01-29 NOTE — Telephone Encounter (Signed)
Pt informed.Carolyn Lane  

## 2015-02-27 ENCOUNTER — Other Ambulatory Visit: Payer: Self-pay | Admitting: Family Medicine

## 2015-03-07 ENCOUNTER — Other Ambulatory Visit: Payer: Self-pay | Admitting: Family Medicine

## 2015-03-09 ENCOUNTER — Other Ambulatory Visit: Payer: Self-pay | Admitting: Family Medicine

## 2015-03-09 ENCOUNTER — Telehealth: Payer: Self-pay | Admitting: *Deleted

## 2015-03-09 NOTE — Telephone Encounter (Signed)
lvm informing pt that her that her hctz was sent to her pharmacy on 3/12 and that she would need a f/u appt for future refills.Loralee PacasBarkley, Deone Leifheit [place]

## 2015-03-11 ENCOUNTER — Telehealth: Payer: Self-pay | Admitting: *Deleted

## 2015-03-11 ENCOUNTER — Other Ambulatory Visit: Payer: Self-pay | Admitting: *Deleted

## 2015-03-11 MED ORDER — HYDROCHLOROTHIAZIDE 25 MG PO TABS: 25.0000 mg | ORAL_TABLET | Freq: Every day | ORAL | Status: DC

## 2015-03-11 NOTE — Telephone Encounter (Signed)
Called and lvm informing pt that she MUST make a f/u appt and that a refill has been sent to her pharmacy however, NO ADDITIONAL REFILLS WILL BE GIVEN UNTIL SHE F/U.Marland Kitchen.Marland Kitchen.Carolyn PacasBarkley, Carolyn Lane

## 2015-03-19 ENCOUNTER — Encounter: Payer: Self-pay | Admitting: Family Medicine

## 2015-03-19 ENCOUNTER — Ambulatory Visit (INDEPENDENT_AMBULATORY_CARE_PROVIDER_SITE_OTHER): Payer: BC Managed Care – PPO | Admitting: Family Medicine

## 2015-03-19 VITALS — BP 117/68 | HR 70 | Ht 70.0 in | Wt 148.0 lb

## 2015-03-19 DIAGNOSIS — Z1159 Encounter for screening for other viral diseases: Secondary | ICD-10-CM | POA: Diagnosis not present

## 2015-03-19 DIAGNOSIS — I10 Essential (primary) hypertension: Secondary | ICD-10-CM | POA: Diagnosis not present

## 2015-03-19 NOTE — Progress Notes (Signed)
   Subjective:    Patient ID: Carolyn Lane, female    DOB: [birth year], 64 y.o.   MRN: [id number]  HPI Hypertension- Pt denies chest pain, SOB, dizziness, or heart palpitations.  Taking meds as directed w/o problems.  Denies medication side effects.  Walking some.    She is concerned. Has been using talc based powder products for her personal hygiene for years. Saw a commmercial on TV that there is inc risk of Ovarian cancer.    Review of Systems     Objective:   Physical Exam  Constitutional: She is oriented to person, place, and time. She appears well-developed and well-nourished.  HENT:  Head: Normocephalic and atraumatic.  Cardiovascular: Normal rate, regular rhythm and normal heart sounds.   Pulmonary/Chest: Effort normal and breath sounds normal.  Neurological: She is alert and oriented to person, place, and time.  Skin: Skin is warm and dry.  Psychiatric: She has a normal mood and affect. Her behavior is normal.          Assessment & Plan:  Hypertension-well-controlled. Continue current regimen follow-up in 6 months. Due for BMP. She'll go to the lab today. Also encouraged hepatitis C screening per new guidelines and recommendations.  Talc powder exposure-certainly there is a potential link with ovarian cancer. I do not know of any protocol as far as screening patients. Ms. early if she starts noticing discomfort pain bloating etc. then we can always move forward with a pelvic ultrasound if needed.

## 2015-03-20 LAB — BASIC METABOLIC PANEL WITH GFR
BUN: 15 mg/dL (ref 6–23)
CALCIUM: 10.2 mg/dL (ref 8.4–10.5)
CO2: 25 mEq/L (ref 19–32)
CREATININE: 0.79 mg/dL (ref 0.50–1.10)
Chloride: 103 mEq/L (ref 96–112)
GFR, Est African American: >89 mL/min
GFR, Est Non African American: 79 mL/min
Glucose, Bld: 72 mg/dL (ref 70–99)
Potassium: 4 mEq/L (ref 3.5–5.3)
Sodium: 140 mEq/L (ref 135–145)

## 2015-03-21 LAB — HEPATITIS C ANTIBODY: HCV Ab: NEGATIVE

## 2015-04-14 ENCOUNTER — Other Ambulatory Visit: Payer: Self-pay | Admitting: *Deleted

## 2015-04-14 MED ORDER — HYDROCHLOROTHIAZIDE 25 MG PO TABS: 25.0000 mg | ORAL_TABLET | Freq: Every day | ORAL | Status: DC

## 2015-10-14 ENCOUNTER — Other Ambulatory Visit: Payer: Self-pay | Admitting: Family Medicine

## 2015-10-14 ENCOUNTER — Ambulatory Visit (INDEPENDENT_AMBULATORY_CARE_PROVIDER_SITE_OTHER): Payer: Medicare PPO | Admitting: Family Medicine

## 2015-10-14 ENCOUNTER — Encounter: Payer: Self-pay | Admitting: Family Medicine

## 2015-10-14 VITALS — BP 135/65 | HR 68 | Temp 98.2°F | Ht 70.0 in | Wt 150.3 lb

## 2015-10-14 DIAGNOSIS — Z13220 Encounter for screening for lipoid disorders: Secondary | ICD-10-CM

## 2015-10-14 DIAGNOSIS — Z1231 Encounter for screening mammogram for malignant neoplasm of breast: Secondary | ICD-10-CM

## 2015-10-14 DIAGNOSIS — Z114 Encounter for screening for human immunodeficiency virus [HIV]: Secondary | ICD-10-CM

## 2015-10-14 DIAGNOSIS — I10 Essential (primary) hypertension: Secondary | ICD-10-CM | POA: Diagnosis not present

## 2015-10-14 DIAGNOSIS — R21 Rash and other nonspecific skin eruption: Secondary | ICD-10-CM

## 2015-10-14 DIAGNOSIS — Z23 Encounter for immunization: Secondary | ICD-10-CM | POA: Diagnosis not present

## 2015-10-14 DIAGNOSIS — L52 Erythema nodosum: Secondary | ICD-10-CM

## 2015-10-14 LAB — COMPLETE METABOLIC PANEL WITH GFR
ALT: 24 U/L (ref 6–29)
AST: 36 U/L — AB (ref 10–35)
Albumin: 4.4 g/dL (ref 3.6–5.1)
Alkaline Phosphatase: 53 U/L (ref 33–130)
BUN: 12 mg/dL (ref 7–25)
CHLORIDE: 103 mmol/L (ref 98–110)
CO2: 28 mmol/L (ref 20–31)
Calcium: 10 mg/dL (ref 8.6–10.4)
Creat: 0.75 mg/dL (ref 0.50–0.99)
GFR, Est African American: >89 mL/min (ref >=60)
GFR, Est Non African American: 84 mL/min (ref >=60)
GLUCOSE: 93 mg/dL (ref 65–99)
POTASSIUM: 4.4 mmol/L (ref 3.5–5.3)
SODIUM: 140 mmol/L (ref 135–146)
Total Bilirubin: 0.4 mg/dL (ref 0.2–1.2)
Total Protein: 7.1 g/dL (ref 6.1–8.1)

## 2015-10-14 LAB — CBC WITH DIFFERENTIAL/PLATELET
BASOS PCT: 0 % (ref 0–1)
Basophils Absolute: 0 10*3/uL (ref 0.0–0.1)
EOS PCT: 3 % (ref 0–5)
Eosinophils Absolute: 0.2 10*3/uL (ref 0.0–0.7)
HEMATOCRIT: 34.8 % — AB (ref 36.0–46.0)
Hemoglobin: 11.7 g/dL — ABNORMAL LOW (ref 12.0–15.0)
LYMPHS PCT: 38 % (ref 12–46)
Lymphs Abs: 2 10*3/uL (ref 0.7–4.0)
MCH: 30.5 pg (ref 26.0–34.0)
MCHC: 33.6 g/dL (ref 30.0–36.0)
MCV: 90.6 fL (ref 78.0–100.0)
MPV: 9.6 fL (ref 8.6–12.4)
Monocytes Absolute: 0.5 10*3/uL (ref 0.1–1.0)
Monocytes Relative: 10 % (ref 3–12)
Neutro Abs: 2.5 10*3/uL (ref 1.7–7.7)
Neutrophils Relative %: 49 % (ref 43–77)
Platelets: 440 10*3/uL — ABNORMAL HIGH (ref 150–400)
RBC: 3.84 MIL/uL — ABNORMAL LOW (ref 3.87–5.11)
RDW: 13 % (ref 11.5–15.5)
WBC: 5.2 10*3/uL (ref 4.0–10.5)

## 2015-10-14 LAB — LIPID PANEL
CHOL/HDL RATIO: 2.1 ratio (ref <=5.0)
Cholesterol: 191 mg/dL (ref 125–200)
HDL: 90 mg/dL (ref >=46)
LDL CALC: 87 mg/dL (ref <130)
Triglycerides: 71 mg/dL (ref <150)
VLDL: 14 mg/dL (ref <30)

## 2015-10-14 LAB — HIV ANTIBODY (ROUTINE TESTING W REFLEX): HIV: NONREACTIVE

## 2015-10-14 MED ORDER — TRIAMCINOLONE ACETONIDE 0.5 % EX CREA: 1.0000 "application " | TOPICAL_CREAM | Freq: Two times a day (BID) | CUTANEOUS | Status: DC | PRN

## 2015-10-14 NOTE — Patient Instructions (Signed)
If the rash keeps recurring then please let us know and we can either do a biopsy or send to dermatology for further treatment and evaluation.

## 2015-10-14 NOTE — Progress Notes (Signed)
Subjective:    Patient ID: Carolyn Lane, female    DOB: 09/04/1951, 64 y.o.   MRN: [id number]  HPI Hypertension- Pt denies chest pain, SOB, dizziness, or heart palpitations.  Taking meds as directed w/o problems.  Denies medication side effects.  Walking and working.    Rash on right upper arm x 2-3 days that is very itchy. Had a rash on her right upper back and neck 2-3 weeks ago similar, that lasted a week. Using cortisone. No new soaps, lotions, perfumes etc. She's been using some over-the-counter hydrocortisone which does help some. Otherwise no known triggers. She does have a cat in the home but denies any types of bites or scratches that she remembers. No other worsening factors.  Due for mammogram.    Would like flu shot today.    Review of Systems  BP 135/65 mmHg  Pulse 68  Temp(Src) 98.2 F (36.8 C)  Ht  (1.778 m)  Wt 150 lb 4.8 oz (68.176 kg)  BMI 21.57 kg/m2    No Known Allergies  No past medical history on file.  Past Surgical History  Procedure Laterality Date  . Abdominal hysterectomy      Social History   Social History  . Marital Status: Married    Spouse Name: N/A  . Number of Children: N/A  . Years of Education: N/A   Occupational History  . Not on file.   Social History Main Topics  . Smoking status: Never Smoker   . Smokeless tobacco: Not on file  . Alcohol Use: 1.8 oz/week    3 Glasses of wine per week     Comment: per day  . Drug Use: No  . Sexual Activity: Not on file     Comment: substitute teacher, MA degree, married, 2 children, walks occ for exercise, 1 caffeine a day.    Other Topics Concern  . Not on file   Social History Narrative    Family History  Problem Relation Age of Onset  . Alcohol abuse Brother   . Alcohol abuse Other   . Heart attack Mother   . Heart attack      Outpatient Encounter Prescriptions as of 10/14/2015  Medication Sig  . hydrochlorothiazide (HYDRODIURIL) 25 MG tablet Take 1 tablet (25 mg  total) by mouth daily.  . traMADol (ULTRAM) 50 MG tablet Take 1 tablet (50 mg total) by mouth every 8 (eight) hours as needed. Maximum dose= 8 tablets per day  . triamcinolone cream (KENALOG) 0.5 % Apply 1 application topically 2 (two) times daily as needed.   No facility-administered encounter medications on file as of 10/14/2015.          Objective:   Physical Exam  Constitutional: She is oriented to person, place, and time. She appears well-developed and well-nourished.  HENT:  Head: Normocephalic and atraumatic.  Cardiovascular: Normal rate, regular rhythm and normal heart sounds.   Pulmonary/Chest: Effort normal and breath sounds normal.  Neurological: She is alert and oriented to person, place, and time.  Skin: Skin is warm and dry.  Psychiatric: She has a normal mood and affect. Her behavior is normal.          Assessment & Plan:  HTN - well controlled. F/U in 6 months.      Rash-unclear etiology. 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 She denies any recent illnesses colds fevers chills etc. It does itch and  has responded somewhat to an over-the-counter cortisone cream so we'll prescribe triamcinolone 0.5% cream. If it continues to recur then please let me know and consider biopsy.  screening mammogram due. Ordered.   Colonoscopy schedule for Monday.    Given flu and Prevnar 13 done.

## 2015-10-15 ENCOUNTER — Other Ambulatory Visit: Payer: Self-pay | Admitting: *Deleted

## 2015-10-15 DIAGNOSIS — R7989 Other specified abnormal findings of blood chemistry: Secondary | ICD-10-CM

## 2015-10-15 DIAGNOSIS — R945 Abnormal results of liver function studies: Principal | ICD-10-CM

## 2015-10-16 LAB — IRON AND TIBC
%SAT: 21 % (ref 11–50)
IRON: 65 ug/dL (ref 45–160)
TIBC: 309 ug/dL (ref 250–450)
UIBC: 244 ug/dL (ref 125–400)

## 2015-10-16 LAB — FERRITIN: FERRITIN: 258 ng/mL (ref 10–291)

## 2015-10-20 LAB — ANTISTREPTOLYSIN O TITER: ASO: 37 IU/mL (ref <409)

## 2015-10-28 ENCOUNTER — Ambulatory Visit (INDEPENDENT_AMBULATORY_CARE_PROVIDER_SITE_OTHER): Payer: Medicare PPO

## 2015-10-28 DIAGNOSIS — Z1231 Encounter for screening mammogram for malignant neoplasm of breast: Secondary | ICD-10-CM | POA: Diagnosis not present

## 2015-11-20 ENCOUNTER — Encounter: Payer: Self-pay | Admitting: Physician Assistant

## 2015-11-20 ENCOUNTER — Ambulatory Visit (INDEPENDENT_AMBULATORY_CARE_PROVIDER_SITE_OTHER): Payer: Medicare PPO | Admitting: Physician Assistant

## 2015-11-20 VITALS — BP 152/61 | HR 89 | Ht 70.0 in | Wt 147.0 lb

## 2015-11-20 DIAGNOSIS — J069 Acute upper respiratory infection, unspecified: Secondary | ICD-10-CM | POA: Diagnosis not present

## 2015-11-20 MED ORDER — HYDROCODONE-HOMATROPINE 5-1.5 MG/5ML PO SYRP: 5.0000 mL | ORAL_SOLUTION | Freq: Every evening | ORAL | Status: DC | PRN

## 2015-11-20 MED ORDER — AMOXICILLIN-POT CLAVULANATE 875-125 MG PO TABS: 1.0000 | ORAL_TABLET | Freq: Two times a day (BID) | ORAL | Status: DC

## 2015-11-20 NOTE — Patient Instructions (Signed)
Upper Respiratory Infection, Adult Most upper respiratory infections (URIs) are a viral infection of the air passages leading to the lungs. A URI affects the nose, throat, and upper air passages. The most common type of URI is nasopharyngitis and is typically referred to as "the common cold." URIs run their course and usually go away on their own. Most of the time, a URI does not require medical attention, but sometimes a bacterial infection in the upper airways can follow a viral infection. This is called a secondary infection. Sinus and middle ear infections are common types of secondary upper respiratory infections. Bacterial pneumonia can also complicate a URI. A URI can worsen asthma and chronic obstructive pulmonary disease (COPD). Sometimes, these complications can require emergency medical care and may be life threatening.  CAUSES Almost all URIs are caused by viruses. A virus is a type of germ and can spread from one person to another.  RISKS FACTORS You may be at risk for a URI if:   You smoke.   You have chronic heart or lung disease.  You have a weakened defense (immune) system.   You are very young or very old.   You have nasal allergies or asthma.  You work in crowded or poorly ventilated areas.  You work in health care facilities or schools. SIGNS AND SYMPTOMS  Symptoms typically develop 2-3 days after you come in contact with a cold virus. Most viral URIs last 7-10 days. However, viral URIs from the influenza virus (flu virus) can last 14-18 days and are typically more severe. Symptoms may include:   Runny or stuffy (congested) nose.   Sneezing.   Cough.   Sore throat.   Headache.   Fatigue.   Fever.   Loss of appetite.   Pain in your forehead, behind your eyes, and over your cheekbones (sinus pain).  Muscle aches.  DIAGNOSIS  Your health care provider may diagnose a URI by:  Physical exam.  Tests to check that your symptoms are not due to  another condition such as:  Strep throat.  Sinusitis.  Pneumonia.  Asthma. TREATMENT  A URI goes away on its own with time. It cannot be cured with medicines, but medicines may be prescribed or recommended to relieve symptoms. Medicines may help:  Reduce your fever.  Reduce your cough.  Relieve nasal congestion. HOME CARE INSTRUCTIONS   Take medicines only as directed by your health care provider.   Gargle warm saltwater or take cough drops to comfort your throat as directed by your health care provider.  Use a warm mist humidifier or inhale steam from a shower to increase air moisture. This may make it easier to breathe.  Drink enough fluid to keep your urine clear or pale yellow.   Eat soups and other clear broths and maintain good nutrition.   Rest as needed.   Return to work when your temperature has returned to normal or as your health care provider advises. You may need to stay home longer to avoid infecting others. You can also use a face mask and careful hand washing to prevent spread of the virus.  Increase the usage of your inhaler if you have asthma.   Do not use any tobacco products, including cigarettes, chewing tobacco, or electronic cigarettes. If you need help quitting, ask your health care provider. PREVENTION  The best way to protect yourself from getting a cold is to practice good hygiene.   Avoid oral or hand contact with people with cold   symptoms.   Wash your hands often if contact occurs.  There is no clear evidence that vitamin C, vitamin E, echinacea, or exercise reduces the chance of developing a cold. However, it is always recommended to get plenty of rest, exercise, and practice good nutrition.  SEEK MEDICAL CARE IF:   You are getting worse rather than better.   Your symptoms are not controlled by medicine.   You have chills.  You have worsening shortness of breath.  You have brown or red mucus.  You have yellow or brown nasal  discharge.  You have pain in your face, especially when you bend forward.  You have a fever.  You have swollen neck glands.  You have pain while swallowing.  You have white areas in the back of your throat. SEEK IMMEDIATE MEDICAL CARE IF:   You have severe or persistent:  Headache.  Ear pain.  Sinus pain.  Chest pain.  You have chronic lung disease and any of the following:  Wheezing.  Prolonged cough.  Coughing up blood.  A change in your usual mucus.  You have a stiff neck.  You have changes in your:  Vision.  Hearing.  Thinking.  Mood. MAKE SURE YOU:   Understand these instructions.  Will watch your condition.  Will get help right away if you are not doing well or get worse.   This information is not intended to replace advice given to you by your health care provider. Make sure you discuss any questions you have with your health care provider.   Document Released: 05/31/2001 Document Revised: 04/21/2015 Document Reviewed: 03/12/2014 Elsevier Interactive Patient Education 2016 [employer]  

## 2015-11-20 NOTE — Progress Notes (Signed)
   Subjective:    Patient ID: Carolyn Lane, female    DOB: [birth year], 64 y.o.   MRN: [id number]  HPI  Pt presents to the clinic with almost 3 week of upper respiratory symptoms. She has a dry cough, sinus pressure, sore throat, ear discomfort, and fatigue. [facility] and was told it was allergies. She took a claritin for a few days with no relief. She has also tried alka seltzer cold plus and contact. She has HTN and stopped fearing her BP would elevate. She is a Engineer, siteschool teacher. Denies any wheezing or SOB. No known fever.     Review of Systems  All other systems reviewed and are negative.      Objective:   Physical Exam  Constitutional: She is oriented to person, place, and time. She appears well-developed and well-nourished.  HENT:  Head: Normocephalic and atraumatic.  TM's clear with some possible fluid accumulation behind bilaterally TM. No blood or pus.   Oropharynx erythematous with no tonsilar swelling or exudate.   Nasal turbinates bilaterally red and swollen.   Some tenderness over maxillary sinuses.   Eyes: Conjunctivae are normal. Right eye exhibits no discharge. Left eye exhibits no discharge.  Neck: Normal range of motion. Neck supple.  Cardiovascular: Normal rate, regular rhythm and normal heart sounds.   Pulmonary/Chest: Effort normal and breath sounds normal. She has no wheezes.  Lymphadenopathy:    She has no cervical adenopathy.  Neurological: She is alert and oriented to person, place, and time.  Skin: Skin is dry.  Psychiatric: She has a normal mood and affect. Her behavior is normal.          Assessment & Plan:  Acute upper respiratory infection- treated with augmentin and hycodan for cough at bedtime. Discussed other symptomatic relief. Follow up if not improving or if symptoms worsening.

## 2015-12-03 ENCOUNTER — Other Ambulatory Visit: Payer: Self-pay | Admitting: Family Medicine

## 2016-01-21 ENCOUNTER — Ambulatory Visit (INDEPENDENT_AMBULATORY_CARE_PROVIDER_SITE_OTHER): Payer: Medicare PPO | Admitting: Family Medicine

## 2016-01-21 ENCOUNTER — Encounter: Payer: Self-pay | Admitting: Family Medicine

## 2016-01-21 VITALS — BP 145/67 | HR 81 | Wt 147.0 lb

## 2016-01-21 DIAGNOSIS — L509 Urticaria, unspecified: Secondary | ICD-10-CM | POA: Diagnosis not present

## 2016-01-21 MED ORDER — TRIAMCINOLONE ACETONIDE 0.5 % EX CREA: 1.0000 "application " | TOPICAL_CREAM | Freq: Two times a day (BID) | CUTANEOUS | Status: DC | PRN

## 2016-01-21 NOTE — Progress Notes (Signed)
       Carolyn Lane is a 65 y.o. female [facility] today for recurrent rash.  Patient has a two day history of erythema on her chest as well as erythema with edema in the left lateral neck. This progressed within 30 minutes and has persisted since then. It is pruritic which is distressing her. She notes she had a similar erythematous rash without swlling in the summer over her flexor surface of R elbow and anterior R shoulder. In December, she noted a erythematous rash on her R forearm near elbow extensor surface with edema. The past rashes were pruritic as well which responded to triamcinolone with the erythema and swelling resolving in a few days.   Patient notes she did wear a wool sweater 2 days ago but hasn't had problems with it in the past. She does have a cat that has access to her bedroom. She denies new soaps, detergents, clothes, being outside in woods, new foods, seasonal allergies, asthma.    No past medical history on file. Past Surgical History  Procedure Laterality Date  . Abdominal hysterectomy     Social History  Substance Use Topics  . Smoking status: Never Smoker   . Smokeless tobacco: Not on file  . Alcohol Use: 1.8 oz/week    3 Glasses of wine per week     Comment: per day   family history includes Alcohol abuse in her brother and other; Heart attack in her mother.  ROS as above Medications: Current Outpatient Prescriptions  Medication Sig Dispense Refill  . hydrochlorothiazide (HYDRODIURIL) 25 MG tablet Take by mouth.    . triamcinolone cream (KENALOG) 0.5 % Apply 1 application topically 2 (two) times daily as needed. 30 g 6   No current facility-administered medications for this visit.   No Known Allergies   Exam:  BP 145/67 mmHg  Pulse 81  Wt 147 lb (66.679 kg) Gen: Well lady in NAD HEENT: EOMI,  MMM left lateral neck patch of erythema  covering the anterior 3rd of her neck, overlying central 3cm circumference of a wheal  Chest: erythematous patch about 5cm wide covering the superior chest  Lungs: Normal work of breathing. CTABL Heart: RRR no MRG Abd: NABS, Soft. Nondistended, Nontender Exts: Brisk capillary refill, warm and well perfused.   No results found for this or any previous visit (from the past 24 hour(s)). No results found.   Please see individual assessment and plan sections.

## 2016-01-21 NOTE — Patient Instructions (Signed)
Thank you for coming in today. You were seen for your recurring rash. This most likely an allergic reaction (hives). We recommend daily Zyrtec. If this makes you too drowsy, try allegra or claritin. Use the triamcinolone for itching.  We recommend keeping a food log as well as keeping track of animal exposures, fabric exposures, soap and perfume exposures. Please come back if worsening or no improvement. We will then consider referral to an allergist.

## 2016-01-21 NOTE — Assessment & Plan Note (Addendum)
Unknown etiology. Likely given picture from December and presentation today. Could be due to the wool sweater. Start Zyrtec daily or alternative if drowsy. Apply triamcinolone for itch. Encouraged patient to keep a food and exposure diary for anticipation of allergist referral with continued recurrence.  Recommend excluding the cat from the bedroom.

## 2016-06-01 ENCOUNTER — Other Ambulatory Visit: Payer: Self-pay | Admitting: Family Medicine

## 2016-06-01 NOTE — Telephone Encounter (Signed)
I am routing this since this medication is on patient's list as a historical med and has not been filled by our office yet. Will call patient to let her know she is overdue for a f/u appt

## 2016-06-02 NOTE — Telephone Encounter (Signed)
I did send prescription but yes please try to call her and let her know she does need to come in for an appointment. Thank you.

## 2016-06-05 ENCOUNTER — Other Ambulatory Visit: Payer: Self-pay | Admitting: Family Medicine

## 2016-07-07 ENCOUNTER — Encounter: Payer: Self-pay | Admitting: Family Medicine

## 2016-07-07 ENCOUNTER — Ambulatory Visit (INDEPENDENT_AMBULATORY_CARE_PROVIDER_SITE_OTHER): Payer: Medicare Other | Admitting: Family Medicine

## 2016-07-07 VITALS — BP 137/60 | HR 83 | Wt 146.0 lb

## 2016-07-07 DIAGNOSIS — Z0000 Encounter for general adult medical examination without abnormal findings: Secondary | ICD-10-CM | POA: Diagnosis not present

## 2016-07-07 DIAGNOSIS — I10 Essential (primary) hypertension: Secondary | ICD-10-CM | POA: Diagnosis not present

## 2016-07-07 DIAGNOSIS — Z780 Asymptomatic menopausal state: Secondary | ICD-10-CM

## 2016-07-07 MED ORDER — AMBULATORY NON FORMULARY MEDICATION: Status: DC

## 2016-07-07 MED ORDER — HYDROCHLOROTHIAZIDE 25 MG PO TABS: 25.0000 mg | ORAL_TABLET | Freq: Every day | ORAL | Status: DC

## 2016-07-07 NOTE — Patient Instructions (Signed)
Keep up a regular exercise program and make sure you are eating a healthy diet Try to eat 4 servings of dairy a day, or if you are lactose intolerant take a calcium with vitamin D daily.  Your vaccines are up to date.   

## 2016-07-07 NOTE — Progress Notes (Signed)
Subjective:    Carolyn Lane is a 65 y.o. female who presents for a Welcome to Medicare exam. She is doing well over all. 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 She is spending a lot of time with her grandsons over the summer. Her last shot was in 2000. Last pneumonia vaccine October 2016. Colonoscopy is up-to-date. Mammogram is due in the fall. She is due for a bone density test. She does exercise 3 days per week.  Review of Systems negative         Objective:    Today's Vitals   07/07/16 1452  BP: 137/60  Pulse: 83  Weight: 146 lb (66.225 kg)  SpO2: 100%  Body mass index is 20.95 kg/(m^2).  Medications Outpatient Encounter Prescriptions as of 07/07/2016  Medication Sig  . hydrochlorothiazide (HYDRODIURIL) 25 MG tablet Take 1 tablet (25 mg total) by mouth daily.  Marland Kitchen triamcinolone cream (KENALOG) 0.5 % Apply 1 application topically 2 (two) times daily as needed.  . [DISCONTINUED] hydrochlorothiazide (HYDRODIURIL) 25 MG tablet Take 1 tablet (25 mg total) by mouth daily. *NO ADDITIONAL REFILLS. PLEASE CALL OFFICE TO SCHEDULE AN APPOINTMENT*  . AMBULATORY NON FORMULARY MEDICATION Medication Name: *Tdap IM x 1   No facility-administered encounter medications on file as of 07/07/2016.     History: No past medical history on file. Past Surgical History  Procedure Laterality Date  . Abdominal hysterectomy      Family History  Problem Relation Age of Onset  . Alcohol abuse Brother   . Alcohol abuse Other   . Heart attack Mother   . Heart attack     Social History   Occupational History  . Not on file.   Social History Main Topics  . Smoking status: Never Smoker   . Smokeless tobacco: Not on file  . Alcohol Use: 1.8 oz/week    3 Glasses of wine per week     Comment: per day  . Drug Use: No  . Sexual Activity: Not on file     Comment: substitute teacher, MA degree, married, 2 children, walks occ for exercise, 1  caffeine a day.     Tobacco Counseling Counseling given: Not Answered   Immunizations and Health Maintenance Immunization History  Administered Date(s) Administered  . Influenza Split 10/17/2011  . Influenza Whole 10/10/2006  . Influenza,inj,Quad PF,36+ Mos 09/03/2014, 10/14/2015  . Pneumococcal Polysaccharide-23 10/14/2015  . Td 12/20/1995, 10/10/2006  . Zoster 10/17/2011   There are no preventive care reminders to display for this patient.  Activities of Daily Living No flowsheet data found.  Physical Exam  See below, or other factors deemed appropriate based on the beneficiary's medical and social history and current clinical standards.  Physical Exam  Constitutional: She is oriented to person, place, and time and well-developed, well-nourished, and in no distress.  HENT:  Head: Normocephalic and atraumatic.  Eyes: Conjunctivae and EOM are normal. Pupils are equal, round, and reactive to light.  Neck: Neck supple. No thyromegaly present.  Cardiovascular: Normal rate, regular rhythm and normal heart sounds.   No murmur heard. Pulmonary/Chest: Effort normal and breath sounds normal.  Abdominal: Bowel sounds are normal. She exhibits no distension and no mass. There is no tenderness.  Musculoskeletal: She exhibits no edema.  Lymphadenopathy:    She has no cervical adenopathy.  Neurological: She is alert and oriented to person, place, and time. She has normal reflexes.  Skin: Skin is warm  and dry.  Psychiatric: Mood, memory, affect and judgment normal.     Advanced Directives: Discussed today, handout discussed today.  Would patient like information on creating an advanced directive?: Yes - Educational materials given    Assessment:    This is a routine wellness examination for this patient .   Vision/Hearing screen  Visual Acuity Screening   Right eye Left eye Both eyes  Without correction:     With correction: 20/20 20/20 20/20     Dietary issues and exercise  activities discussed:     Goals    None     Depression Screen PHQ 2/9 Scores 07/07/2016 10/14/2015  PHQ - 2 Score 0 0     Fall Risk Fall Risk  07/07/2016  Falls in the past year? No    MMSE: No flowsheet data found.   Passed 6CIT   Patient Care Team: Agapito Gamesatherine D Margarite Vessel, MD as PCP - General     Plan:     During the course of the visit the patient was educated and counseled about the following appropriate screening and preventive services:   Vaccines to include Pneumoccal, Influenza, Hepatitis B, Td, Zostavax, HCV  Electrocardiogram - EKG show a of 71 bpm, normal sinus rhythm with normal axis with right bundle branch block. willget copy of old EKG from paper chart. Likely 2010.   Cardiovascular Disease  Colorectal cancer screening - UTD.  Bone density screening- will schedule.   Diabetes screening  Glaucoma screening  Mammography/PAP - UTD  Nutrition counseling  Her current medications and allergies were reviewed and needed refills of her chronic medications were ordered. The plan for yearly health maintenance was discussed and all orders and referrals were made as appropriate.  Patient Instructions (the written plan) was given to the patient.   Ramir Malerba, MD 07/07/2016

## 2016-07-18 LAB — LIPID PANEL
CHOLESTEROL: 203 mg/dL — AB (ref 125–200)
HDL: 133 mg/dL (ref >=46)
LDL CALC: 41 mg/dL (ref <130)
TRIGLYCERIDES: 147 mg/dL (ref <150)
Total CHOL/HDL Ratio: 1.5 Ratio (ref <=5.0)
VLDL: 29 mg/dL (ref <30)

## 2016-07-18 LAB — COMPLETE METABOLIC PANEL WITH GFR
ALT: 20 U/L (ref 6–29)
AST: 41 U/L — AB (ref 10–35)
Albumin: 4.4 g/dL (ref 3.6–5.1)
Alkaline Phosphatase: 53 U/L (ref 33–130)
BILIRUBIN TOTAL: 0.6 mg/dL (ref 0.2–1.2)
BUN: 15 mg/dL (ref 7–25)
CHLORIDE: 102 mmol/L (ref 98–110)
CO2: 25 mmol/L (ref 20–31)
CREATININE: 0.9 mg/dL (ref 0.50–0.99)
Calcium: 9.3 mg/dL (ref 8.6–10.4)
GFR, EST AFRICAN AMERICAN: 77 mL/min (ref >=60)
GFR, Est Non African American: 67 mL/min (ref >=60)
Glucose, Bld: 90 mg/dL (ref 65–99)
Potassium: 3.8 mmol/L (ref 3.5–5.3)
Sodium: 136 mmol/L (ref 135–146)
TOTAL PROTEIN: 6.9 g/dL (ref 6.1–8.1)

## 2016-07-27 ENCOUNTER — Ambulatory Visit (INDEPENDENT_AMBULATORY_CARE_PROVIDER_SITE_OTHER): Payer: Medicare Other

## 2016-07-27 DIAGNOSIS — M85852 Other specified disorders of bone density and structure, left thigh: Secondary | ICD-10-CM

## 2016-07-27 DIAGNOSIS — Z780 Asymptomatic menopausal state: Secondary | ICD-10-CM

## 2016-10-14 ENCOUNTER — Other Ambulatory Visit: Payer: Self-pay | Admitting: Family Medicine

## 2016-10-14 DIAGNOSIS — Z0000 Encounter for general adult medical examination without abnormal findings: Secondary | ICD-10-CM

## 2016-11-01 ENCOUNTER — Ambulatory Visit (INDEPENDENT_AMBULATORY_CARE_PROVIDER_SITE_OTHER): Payer: Medicare Other

## 2016-11-01 DIAGNOSIS — Z1231 Encounter for screening mammogram for malignant neoplasm of breast: Secondary | ICD-10-CM | POA: Diagnosis not present

## 2016-11-01 DIAGNOSIS — Z0000 Encounter for general adult medical examination without abnormal findings: Secondary | ICD-10-CM

## 2017-02-01 ENCOUNTER — Ambulatory Visit (INDEPENDENT_AMBULATORY_CARE_PROVIDER_SITE_OTHER): Payer: Medicare Other

## 2017-02-01 ENCOUNTER — Ambulatory Visit (INDEPENDENT_AMBULATORY_CARE_PROVIDER_SITE_OTHER): Payer: Medicare Other | Admitting: Family Medicine

## 2017-02-01 ENCOUNTER — Encounter: Payer: Self-pay | Admitting: Family Medicine

## 2017-02-01 VITALS — BP 140/70 | HR 75 | Ht 70.0 in | Wt 149.0 lb

## 2017-02-01 DIAGNOSIS — M2142 Flat foot [pes planus] (acquired), left foot: Secondary | ICD-10-CM | POA: Diagnosis not present

## 2017-02-01 DIAGNOSIS — M7731 Calcaneal spur, right foot: Secondary | ICD-10-CM | POA: Diagnosis not present

## 2017-02-01 DIAGNOSIS — M79671 Pain in right foot: Secondary | ICD-10-CM

## 2017-02-01 DIAGNOSIS — I10 Essential (primary) hypertension: Secondary | ICD-10-CM | POA: Diagnosis not present

## 2017-02-01 DIAGNOSIS — M2141 Flat foot [pes planus] (acquired), right foot: Secondary | ICD-10-CM

## 2017-02-01 MED ORDER — AMBULATORY NON FORMULARY MEDICATION: 0 refills | Status: DC
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
## 2017-03-15 IMAGING — DX DG FOOT COMPLETE 3+V*R*
3 series · 3 of 3 positions shown · non-contrast
Comparison: None.

CLINICAL DATA: RIGHT foot pain near the heads of the fourth and
fifth metatarsals and in midfoot for 1 week, no known injury

EXAM:
RIGHT FOOT COMPLETE - 3+ VIEW

[foot ap]
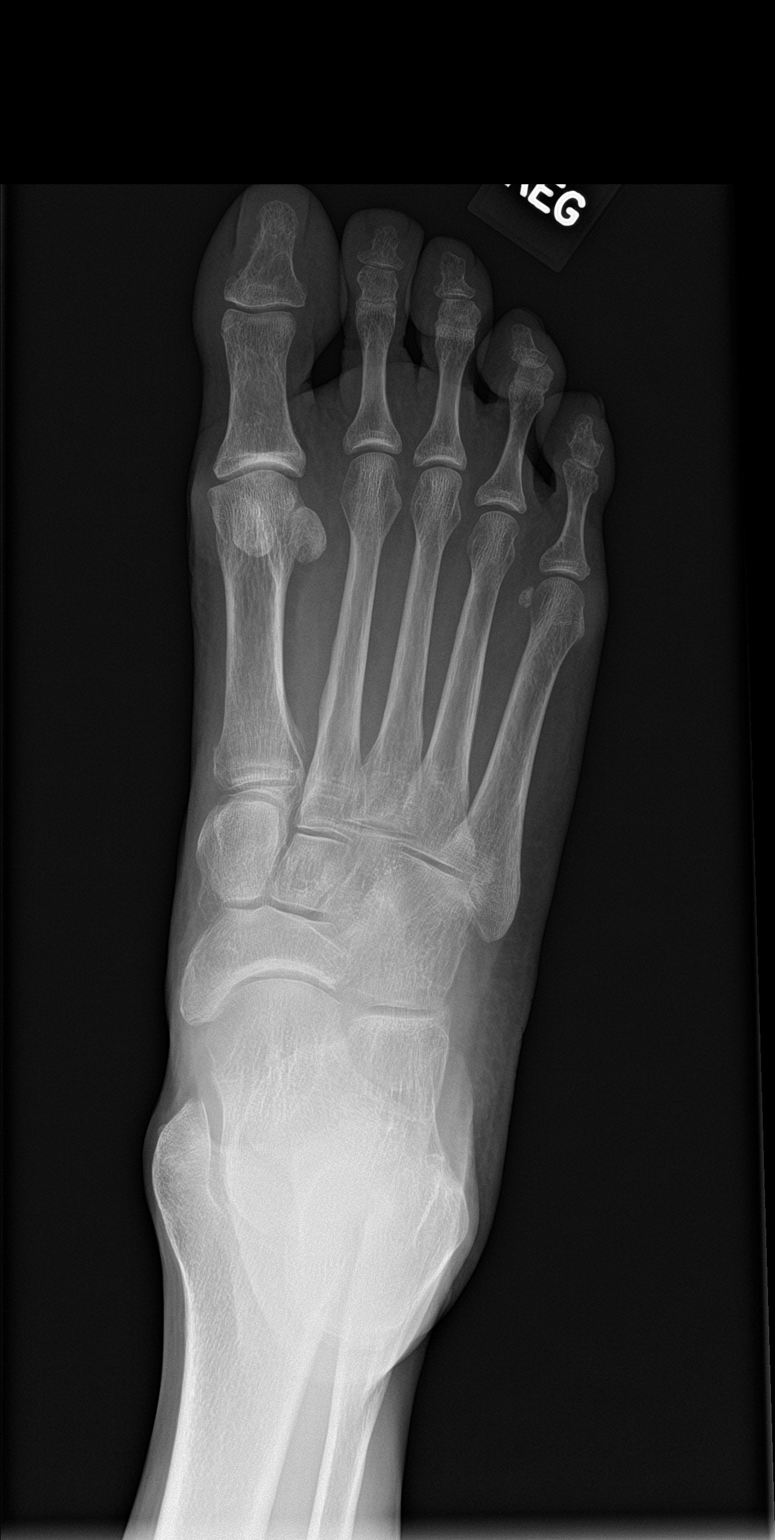

[foot obl]
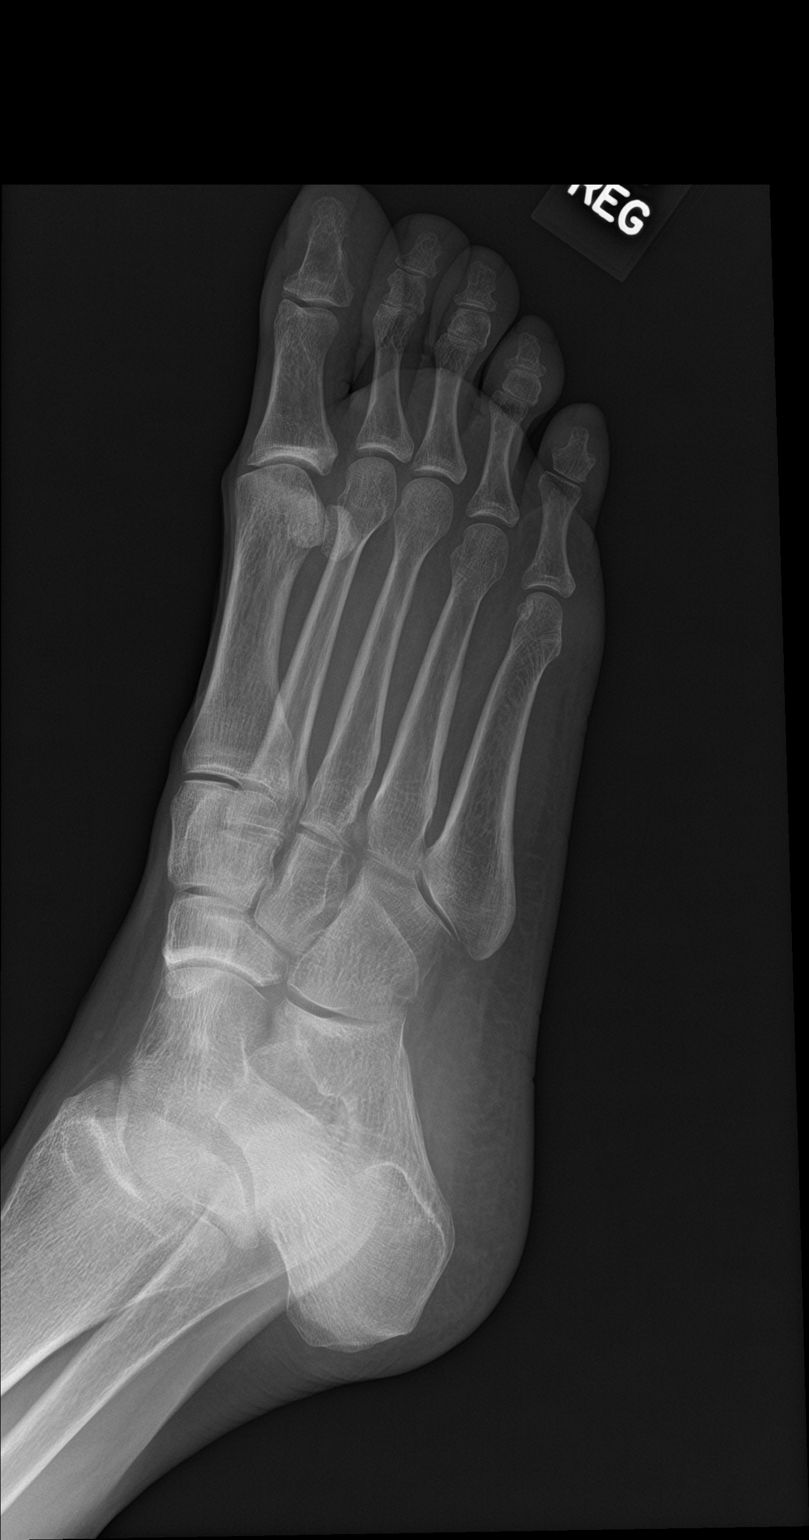

[foot lat]
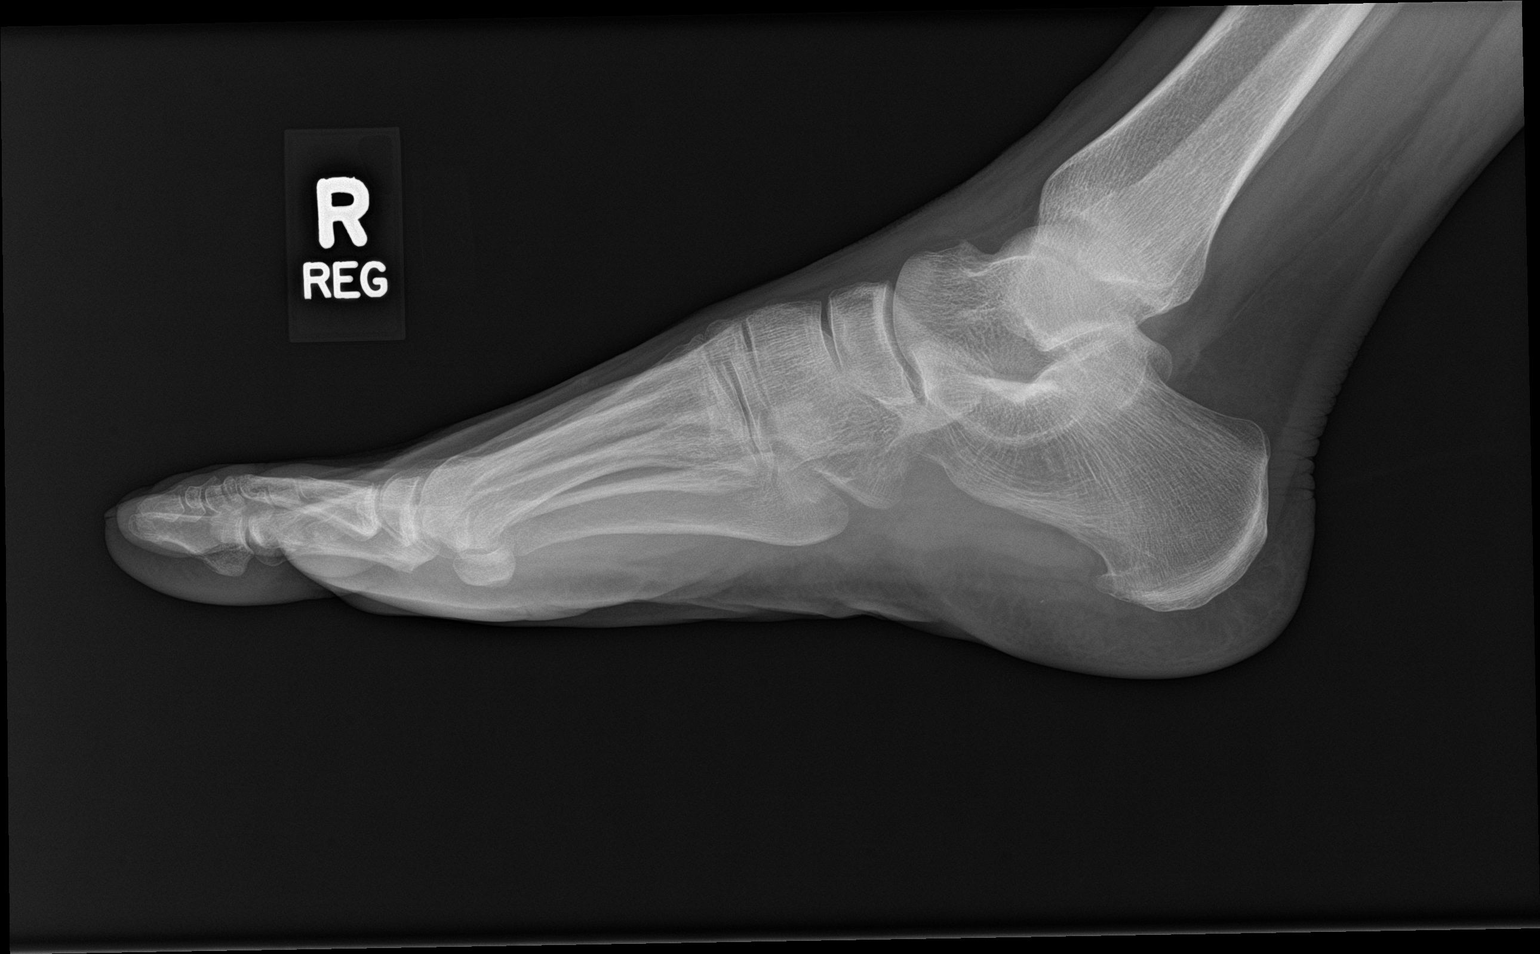

[3 of 3 positions shown; findings below may reference images not displayed]

FINDINGS: Osseous demineralization.

Joint spaces preserved.

Small plantar calcaneal spur.

No acute fracture, dislocation, or bone destruction.
IMPRESSION: Small plantar calcaneal spur without acute bony abnormalities.

## 2017-03-31 ENCOUNTER — Other Ambulatory Visit: Payer: Self-pay

## 2017-03-31 DIAGNOSIS — I10 Essential (primary) hypertension: Secondary | ICD-10-CM

## 2017-03-31 MED ORDER — HYDROCHLOROTHIAZIDE 25 MG PO TABS: 25.0000 mg | ORAL_TABLET | Freq: Every day | ORAL | 0 refills | Status: DC

## 2017-04-24 ENCOUNTER — Ambulatory Visit: Payer: Medicare Other | Admitting: Family Medicine

## 2017-05-01 ENCOUNTER — Ambulatory Visit: Payer: Medicare Other | Admitting: Family Medicine

## 2017-05-01 ENCOUNTER — Other Ambulatory Visit: Payer: Self-pay

## 2017-05-01 DIAGNOSIS — I10 Essential (primary) hypertension: Secondary | ICD-10-CM

## 2017-05-01 MED ORDER — HYDROCHLOROTHIAZIDE 25 MG PO TABS: 25.0000 mg | ORAL_TABLET | Freq: Every day | ORAL | 0 refills | Status: DC

## 2017-05-05 ENCOUNTER — Ambulatory Visit: Payer: Medicare Other | Admitting: Family Medicine

## 2017-05-22 ENCOUNTER — Encounter: Payer: Self-pay | Admitting: Family Medicine

## 2017-05-22 ENCOUNTER — Ambulatory Visit (INDEPENDENT_AMBULATORY_CARE_PROVIDER_SITE_OTHER): Payer: Medicare Other | Admitting: Family Medicine

## 2017-05-22 VITALS — BP 138/70 | HR 73 | Ht 68.11 in | Wt 151.0 lb

## 2017-05-22 DIAGNOSIS — Z7189 Other specified counseling: Secondary | ICD-10-CM

## 2017-05-22 DIAGNOSIS — Z7184 Encounter for health counseling related to travel: Secondary | ICD-10-CM

## 2017-05-22 DIAGNOSIS — I10 Essential (primary) hypertension: Secondary | ICD-10-CM

## 2017-05-22 DIAGNOSIS — Z23 Encounter for immunization: Secondary | ICD-10-CM

## 2017-05-22 MED ORDER — LISINOPRIL-HYDROCHLOROTHIAZIDE 20-25 MG PO TABS: 1.0000 | ORAL_TABLET | Freq: Every day | ORAL | 3 refills | Status: DC

## 2017-05-22 NOTE — Progress Notes (Signed)
   Subjective:    Patient ID: Carolyn Lane, female    DOB: 1951/09/07, 66 y.o.   MRN: [id number]  HPI   Hypertension- Pt denies chest pain, SOB, dizziness, or heart palpitations.  Taking meds as directed w/o problems.  Denies medication side effects.    She is also planning to travel to [place] in the fall to stay with her daughter. Her daughter is working for [employer] and is living there for the time being. She wants to make sure that her vaccinations are up-to-date. [place] in [place].    Review of Systems     Objective:   Physical Exam  Constitutional: She is oriented to person, place, and time. She appears well-developed and well-nourished.  HENT:  Head: Normocephalic and atraumatic.  Cardiovascular: Normal rate, regular rhythm and normal heart sounds.   Pulmonary/Chest: Effort normal and breath sounds normal.  Neurological: She is alert and oriented to person, place, and time.  Skin: Skin is warm and dry.  Psychiatric: She has a normal mood and affect. Her behavior is normal.        Assessment & Plan:  HTN - would recommend we adjust Med for better BP control. Add ACEi.  Check BMP in 1-2 weeks after start medication.  F/U in 4 weeks for Nurse visit to recheck BP.  Prevnar given today.    Travel counseling- Will give 1st  Hep A.  Encouraged her to go ahead and get her tetanus updated at the local pharmacy. Carolyn Lane has the prescription.

## 2017-05-30 ENCOUNTER — Ambulatory Visit (INDEPENDENT_AMBULATORY_CARE_PROVIDER_SITE_OTHER): Payer: Medicare Other | Admitting: Family Medicine

## 2017-05-30 VITALS — BP 132/64 | HR 71

## 2017-05-30 DIAGNOSIS — Z23 Encounter for immunization: Secondary | ICD-10-CM | POA: Diagnosis not present

## 2017-05-30 NOTE — Progress Notes (Signed)
Agree with above 

## 2017-05-30 NOTE — Progress Notes (Signed)
Pt came into clinic today to get her first twinrix. Pt received the Hep A and Prevnar 13 at last OV. Pt tolerated twinrix injection in left deltoid well, no immediate complications. Pt has an appt next week for second twinrix injection.

## 2017-06-08 LAB — BASIC METABOLIC PANEL WITH GFR
BUN: 18 mg/dL (ref 7–25)
CALCIUM: 9.8 mg/dL (ref 8.6–10.4)
CO2: 25 mmol/L (ref 20–31)
CREATININE: 0.83 mg/dL (ref 0.50–0.99)
Chloride: 101 mmol/L (ref 98–110)
GFR, EST AFRICAN AMERICAN: 85 mL/min (ref >=60)
GFR, EST NON AFRICAN AMERICAN: 74 mL/min (ref >=60)
Glucose, Bld: 80 mg/dL (ref 65–99)
Potassium: 4.2 mmol/L (ref 3.5–5.3)
SODIUM: 138 mmol/L (ref 135–146)

## 2017-06-08 LAB — LIPID PANEL W/REFLEX DIRECT LDL
Cholesterol: 219 mg/dL — ABNORMAL HIGH (ref <200)
HDL: 119 mg/dL (ref >50)
LDL-Cholesterol: 80 mg/dL
Non-HDL Cholesterol (Calc): 100 mg/dL (ref <130)
TRIGLYCERIDES: 102 mg/dL (ref <150)
Total CHOL/HDL Ratio: 1.8 Ratio (ref <5.0)

## 2017-06-10 NOTE — Progress Notes (Signed)
All labs are normal. 

## 2017-06-20 ENCOUNTER — Other Ambulatory Visit: Payer: Self-pay | Admitting: Family Medicine

## 2017-06-20 ENCOUNTER — Ambulatory Visit (INDEPENDENT_AMBULATORY_CARE_PROVIDER_SITE_OTHER): Payer: Medicare Other | Admitting: Family Medicine

## 2017-06-20 VITALS — BP 112/60 | HR 65 | Temp 98.0°F | Ht 69.0 in | Wt 147.1 lb

## 2017-06-20 DIAGNOSIS — I10 Essential (primary) hypertension: Secondary | ICD-10-CM

## 2017-06-20 DIAGNOSIS — Z23 Encounter for immunization: Secondary | ICD-10-CM

## 2017-06-20 MED ORDER — LISINOPRIL-HYDROCHLOROTHIAZIDE 20-12.5 MG PO TABS: 1.0000 | ORAL_TABLET | Freq: Every day | ORAL | 0 refills | Status: DC

## 2017-06-20 NOTE — Progress Notes (Signed)
Diastolic is low today. We'll decrease the HCTZ component of her blood pressure pill. Follow-up in 23 weeks for nurse blood pressure check.  Carolyn Gasseratherine Latice Waitman, MD

## 2017-06-20 NOTE — Progress Notes (Signed)
Agree with above.  Purity Irmen, MD  

## 2017-06-20 NOTE — Progress Notes (Signed)
   Subjective:    Patient ID: Carolyn Lane, female    DOB: 10/09/1951, 66 y.o.   MRN: [id number]  HPI Pt is here for Hep A and Hep B injection. Pt denies fever, chest pain, and SOB.   Review of Systems     Objective:   Physical Exam        Assessment & Plan:  Pt tolerated injection well in right Deltoid and without any problems. Dr. Linford ArnoldMetheney decreased pt's Lisinopril-hydrochlorothiazide to 20-12.5 mg due to pt's BP being 120/49. Pt informed.

## 2017-07-04 ENCOUNTER — Ambulatory Visit (INDEPENDENT_AMBULATORY_CARE_PROVIDER_SITE_OTHER): Payer: Medicare Other | Admitting: Family Medicine

## 2017-07-04 VITALS — BP 134/60 | HR 77 | Wt 147.0 lb

## 2017-07-04 DIAGNOSIS — I10 Essential (primary) hypertension: Secondary | ICD-10-CM | POA: Diagnosis not present

## 2017-07-04 NOTE — Progress Notes (Signed)
Pt denies chest pain, SOB, dizziness, or heart palpitations.  Taking meds as directed w/o problems.  Denies medication side effects.  5 min spent with pt.  Patient's Bp medication was decreased by provider due to BP being too low. Patient wanted me to recheck her BP since the sytsolic number was over 130.

## 2017-07-04 NOTE — Progress Notes (Signed)
Call patient and see what she would like to do. The top number was greater than 130 today and we have talked about trying to keep it under 130 if possible. We can either go back to the lisinopril 20/25 or we can keep with current regimen and then check the pressure again in a couple of months. See what she would prefer to do.

## 2017-07-05 ENCOUNTER — Telehealth: Payer: Self-pay | Admitting: *Deleted

## 2017-07-05 NOTE — Telephone Encounter (Signed)
Patient will stay on her current regimen and recheck her blood pressure in September

## 2017-07-05 NOTE — Progress Notes (Signed)
Patient will stay on current regimen and then recheck at nurse visit in September

## 2017-08-01 ENCOUNTER — Telehealth: Payer: Self-pay

## 2017-08-01 ENCOUNTER — Other Ambulatory Visit: Payer: Self-pay | Admitting: *Deleted

## 2017-08-01 MED ORDER — TRIAMCINOLONE ACETONIDE 0.5 % EX CREA: 1.0000 "application " | TOPICAL_CREAM | Freq: Two times a day (BID) | CUTANEOUS | 6 refills | Status: DC | PRN

## 2017-08-01 NOTE — Telephone Encounter (Signed)
Carolyn Lane has a flare up of hives. She would like a refill of triamcinolone cream. Prescribed by Dr Denyse Amassorey. She states the rash is on her neck and the back of her hands. The rash does itch. However, she states it is not poison ivy. It is the same rash she has had multiple times before. Please advise.

## 2017-08-01 NOTE — Telephone Encounter (Signed)
OK to fill

## 2017-08-02 MED ORDER — TRIAMCINOLONE ACETONIDE 0.5 % EX CREA: 1.0000 "application " | TOPICAL_CREAM | Freq: Two times a day (BID) | CUTANEOUS | 6 refills | Status: AC | PRN

## 2017-08-02 NOTE — Telephone Encounter (Signed)
Rx sent and patient is aware. 

## 2017-08-22 ENCOUNTER — Ambulatory Visit (INDEPENDENT_AMBULATORY_CARE_PROVIDER_SITE_OTHER): Payer: Medicare Other | Admitting: Family Medicine

## 2017-08-22 VITALS — BP 115/55 | HR 80

## 2017-08-22 DIAGNOSIS — I10 Essential (primary) hypertension: Secondary | ICD-10-CM | POA: Diagnosis not present

## 2017-08-22 NOTE — Progress Notes (Signed)
HTN - BP at goal. Home blood pressures over all look good as well. We'll continue with current regimen  Nani Gasseratherine Moussa Wiegand, MD

## 2017-08-22 NOTE — Progress Notes (Signed)
   Subjective:    Patient ID: Carolyn Lane, female    DOB: [birth year], 66 y.o.   MRN: [id number]  HPI   Dondra SpryGail is here for blood pressure check.   Home blood pressure readings -  07/05/17  122/69 - 75 07/07/17  115/65 - 71 07/08/17  129/70 - 66 07/09/17  126/70 - 67 07/10/17  121/71 - 77 07/11/17  135/75 - 78 07/12/17  105/59 - 77 07/13/17  112/64 - 64 07/13/17  116/70 - 74 07/16/17  123/57 - 74 07/17/17  101/54 - 84 07/18/17  122/57 - 63 07/27/17    134/64 - 69 07/28/17  114/55 - 76 07/31/17  121/75 - 64 08/01/17  121/67 - 60 08/02/17  126/66 - 77 08/04/17  125/70 - 66 08/19/17    140/75 - 65 08/19/17    123/68 - 75 08/21/17    134/76 - 72 Review of Systems  Constitutional: Negative.   HENT: Negative.   Respiratory: Negative.   Cardiovascular: Negative.        Objective:   Physical Exam        Assessment & Plan:  Hypertension - Blood pressure was 115/55 after recheck.

## 2017-08-31 ENCOUNTER — Encounter: Payer: Self-pay | Admitting: Family Medicine

## 2017-08-31 ENCOUNTER — Ambulatory Visit (INDEPENDENT_AMBULATORY_CARE_PROVIDER_SITE_OTHER): Payer: Medicare Other | Admitting: Family Medicine

## 2017-08-31 VITALS — BP 126/68 | HR 82 | Temp 98.8°F | Ht 69.0 in | Wt 149.0 lb

## 2017-08-31 DIAGNOSIS — J069 Acute upper respiratory infection, unspecified: Secondary | ICD-10-CM | POA: Diagnosis not present

## 2017-08-31 MED ORDER — HYDROCODONE-HOMATROPINE 5-1.5 MG/5ML PO SYRP: 5.0000 mL | ORAL_SOLUTION | Freq: Four times a day (QID) | ORAL | 0 refills | Status: DC | PRN

## 2017-08-31 MED ORDER — BENZONATATE 200 MG PO CAPS: 200.0000 mg | ORAL_CAPSULE | Freq: Three times a day (TID) | ORAL | 0 refills | Status: DC | PRN

## 2017-08-31 NOTE — Progress Notes (Signed)
   Subjective:    Patient ID: Carolyn Lane, female    DOB: 02/25/1951, 66 y.o.   MRN: [id number]  HPI  66 year old female comes in today complaining of a cough, sore throat and runny nose that started yesterday. She says she coughs so much last night but it kept her up. Her chest is actually starting to get sore that she's not having any wheezing. She said there is also some mild amount of sputum. No fevers chills or sweats. Her sister has a similar illness.  Review of Systems     Objective:   Physical Exam  Constitutional: She is oriented to person, place, and time. She appears well-developed and well-nourished.  HENT:  Head: Normocephalic and atraumatic.  Right Ear: External ear normal.  Left Ear: External ear normal.  Nose: Nose normal.  Mouth/Throat: Oropharynx is clear and moist.  TMs and canals are clear.   Eyes: Pupils are equal, round, and reactive to light. Conjunctivae and EOM are normal.  Neck: Neck supple. No thyromegaly present.  Cardiovascular: Normal rate, regular rhythm and normal heart sounds.   Pulmonary/Chest: Effort normal and breath sounds normal. She has no wheezes.  Lymphadenopathy:    She has no cervical adenopathy.  Neurological: She is alert and oriented to person, place, and time.  Skin: Skin is warm and dry.  Psychiatric: She has a normal mood and affect.          Assessment & Plan:  Upper respiratory infection-reassured patient that this is viral and should resolve on its own. Recommend over-the-counter zinc lozenges, nasal saline irrigation. Given Tessalon Perles to take during the day and patient requested nighttime cough syrup. I did verify any recent use of narcotics in the [place] registry. It was negative for anything in the last 6 months. Call if not significanty better in one week.

## 2017-08-31 NOTE — Patient Instructions (Addendum)
Upper Respiratory Infection, Adult Most upper respiratory infections (URIs) are caused by a virus. A URI affects the nose, throat, and upper air passages. The most common type of URI is often called "the common cold." Follow these instructions at home:  Take medicines only as told by your doctor.  Gargle warm saltwater or take cough drops to comfort your throat as told by your doctor.  Use a warm mist humidifier or inhale steam from a shower to increase air moisture. This may make it easier to breathe.  Drink enough fluid to keep your pee (urine) clear or pale yellow.  Eat soups and other clear broths.  Have a healthy diet.  Rest as needed.  Go back to work when your fever is gone or your doctor says it is okay. ? You may need to stay home longer to avoid giving your URI to others. ? You can also wear a face mask and wash your hands often to prevent spread of the virus.  Use your inhaler more if you have asthma.  Do not use any tobacco products, including cigarettes, chewing tobacco, or electronic cigarettes. If you need help quitting, ask your doctor. Contact a doctor if:  You are getting worse, not better.  Your symptoms are not helped by medicine.  You have chills.  You are getting more short of breath.  You have brown or red mucus.  You have yellow or brown discharge from your nose.  You have pain in your face, especially when you bend forward.  You have a fever.  You have puffy (swollen) neck glands.  You have pain while swallowing.  You have white areas in the back of your throat. Get help right away if:  You have very bad or constant: ? Headache. ? Ear pain. ? Pain in your forehead, behind your eyes, and over your cheekbones (sinus pain). ? Chest pain.  You have long-lasting (chronic) lung disease and any of the following: ? Wheezing. ? Long-lasting cough. ? Coughing up blood. ? A change in your usual mucus.  You have a stiff neck.  You have  changes in your: ? Vision. ? Hearing. ? Thinking. ? Mood. This information is not intended to replace advice given to you by your health care provider. Make sure you discuss any questions you have with your health care provider. Document Released: 05/23/2008 Document Revised: 08/07/2016 Document Reviewed: 03/12/2014 Elsevier Interactive Patient Education  2018 Elsevier Inc[employer]  

## 2017-09-05 ENCOUNTER — Telehealth: Payer: Self-pay | Admitting: Family Medicine

## 2017-09-05 NOTE — Telephone Encounter (Signed)
Please call pt: see when she is leaving for [place]?  I can't remember.

## 2017-09-05 NOTE — Telephone Encounter (Signed)
Left VM for pt to notify the office.

## 2017-09-06 NOTE — Telephone Encounter (Signed)
lvm asking pt to rtn call.Carolyn Lane  

## 2017-09-06 NOTE — Telephone Encounter (Signed)
lvm asking pt to rtn call about [place].Loralee Pacas [place]

## 2017-09-08 NOTE — Telephone Encounter (Signed)
Pt reports that she will be leaving on or after 10/01/2017.Carolyn Lane

## 2017-09-13 MED ORDER — TYPHOID VACCINE PO CPDR
1.0000 | DELAYED_RELEASE_CAPSULE | ORAL | 0 refills | Status: DC
Start: 2017-09-13 — End: 2018-05-17

## 2017-09-13 NOTE — Telephone Encounter (Signed)
Spoke w/pt and informed her that she will need typhoid and that it has been sent to her pharmacy. She then informed me that her daughter told her that she will need a "world health organization yellow card" this card is a vaccination card. I advised that we can give her a print out of her vaccinations however, if this is what is required she should contact the health dept and we will help in whatever way we can to get this accomplished for her.Heath Gold'

## 2017-09-13 NOTE — Telephone Encounter (Signed)
Molli Knock, is also recommended that she get typhoid vaccination. This actually comes in a pill. She'll just take 1 every other day for a total of 4 pills. She needs to go ahead and start it at least 2 weeks before she leaves so probably in the next week. Please tell her I hope that she has a wonderful visit

## 2017-09-25 ENCOUNTER — Other Ambulatory Visit: Payer: Self-pay | Admitting: *Deleted

## 2017-09-25 MED ORDER — LISINOPRIL-HYDROCHLOROTHIAZIDE 20-12.5 MG PO TABS: 1.0000 | ORAL_TABLET | Freq: Every day | ORAL | 1 refills | Status: DC

## 2017-11-14 ENCOUNTER — Other Ambulatory Visit: Payer: Self-pay | Admitting: Family Medicine

## 2017-11-14 DIAGNOSIS — Z1239 Encounter for other screening for malignant neoplasm of breast: Secondary | ICD-10-CM

## 2017-11-16 ENCOUNTER — Ambulatory Visit (INDEPENDENT_AMBULATORY_CARE_PROVIDER_SITE_OTHER): Payer: Medicare Other

## 2017-11-16 DIAGNOSIS — Z1231 Encounter for screening mammogram for malignant neoplasm of breast: Secondary | ICD-10-CM | POA: Diagnosis not present

## 2017-11-16 DIAGNOSIS — Z1239 Encounter for other screening for malignant neoplasm of breast: Secondary | ICD-10-CM

## 2018-03-26 ENCOUNTER — Other Ambulatory Visit: Payer: Self-pay

## 2018-03-26 MED ORDER — LISINOPRIL-HYDROCHLOROTHIAZIDE 20-12.5 MG PO TABS: 1.0000 | ORAL_TABLET | Freq: Every day | ORAL | 0 refills | Status: DC

## 2018-05-17 ENCOUNTER — Encounter: Payer: Self-pay | Admitting: Family Medicine

## 2018-05-17 ENCOUNTER — Ambulatory Visit (INDEPENDENT_AMBULATORY_CARE_PROVIDER_SITE_OTHER): Payer: Medicare Other | Admitting: Family Medicine

## 2018-05-17 VITALS — BP 133/58 | HR 73 | Ht 69.02 in | Wt 148.0 lb

## 2018-05-17 DIAGNOSIS — I10 Essential (primary) hypertension: Secondary | ICD-10-CM

## 2018-05-17 DIAGNOSIS — R053 Chronic cough: Secondary | ICD-10-CM

## 2018-05-17 DIAGNOSIS — R05 Cough: Secondary | ICD-10-CM | POA: Diagnosis not present

## 2018-05-17 DIAGNOSIS — T63461A Toxic effect of venom of wasps, accidental (unintentional), initial encounter: Secondary | ICD-10-CM | POA: Diagnosis not present

## 2018-05-17 MED ORDER — OLMESARTAN MEDOXOMIL-HCTZ 20-12.5 MG PO TABS: 1.0000 | ORAL_TABLET | Freq: Every day | ORAL | 1 refills | Status: DC

## 2018-05-17 MED ORDER — PREDNISONE 20 MG PO TABS: ORAL_TABLET | ORAL | 0 refills | Status: DC

## 2018-05-17 NOTE — Progress Notes (Signed)
Subjective:    Patient ID: Carolyn Lane, female    DOB: [birth year], 67 y.o.   MRN: [id number]  HPI 67 year old female comes in today complaining that a wasp stung her right near her eye 2 days ago.v It has been swollen and itchy and using.  She did try some Benadryl but it did not seem to help very much.  Hypertension- Pt denies chest pain, SOB, dizziness, or heart palpitations.  Taking meds as directed w/o problems.  Denies medication side effects.    Does complain of a chronic cough.  It seems to be a little worse at night and it seems to be gradually getting worse over the last several months.  She is on an ACE inhibitor.  No fevers chills or sweats or recent cold symptoms.  Review of Systems  BP (!) 133/58   Pulse 73   Ht 5' 9.02" (1.753 m)   Wt 148 lb (67.1 kg)   SpO2 100%   BMI 21.85 kg/m     No Known Allergies  No past medical history on file.  Past Surgical History:  Procedure Laterality Date  . ABDOMINAL HYSTERECTOMY      Social History   Socioeconomic History  . Marital status: Married    Spouse name: Not on file  . Number of children: Not on file  . Years of education: Not on file  . Highest education level: Not on file  Occupational History  . Not on file  Social Needs  . Financial resource strain: Not on file  . Food insecurity:    Worry: Not on file    Inability: Not on file  . Transportation needs:    Medical: Not on file    Non-medical: Not on file  Tobacco Use  . Smoking status: Never Smoker  . Smokeless tobacco: Never Used  Substance and Sexual Activity  . Alcohol use: Yes    Alcohol/week: 1.8 oz    Types: 3 Glasses of wine per week    Comment: per day  . Drug use: No  . Sexual activity: Not on file    Comment: substitute teacher, MA degree, married, 2 children, walks occ for exercise, 1 caffeine a day.   Lifestyle  . Physical activity:    Days per week: Not on file    Minutes per session: Not on file  . Stress: Not on file   Relationships  . Social connections:    Talks on phone: Not on file    Gets together: Not on file    Attends religious service: Not on file    Active member of club or organization: Not on file    Attends meetings of clubs or organizations: Not on file    Relationship status: Not on file  . Intimate partner violence:    Fear of current or ex partner: Not on file    Emotionally abused: Not on file    Physically abused: Not on file    Forced sexual activity: Not on file  Other Topics Concern  . Not on file  Social History Narrative  . Not on file    Family History  Problem Relation Age of Onset  . Alcohol abuse Brother   . Alcohol abuse Other   . Heart attack Mother   . Heart attack Unknown     Outpatient Encounter Medications as of 05/17/2018  Medication Sig  . triamcinolone cream (KENALOG) 0.5 % Apply 1 application topically 2 (two) times daily as needed.  . [  DISCONTINUED] lisinopril-hydrochlorothiazide (PRINZIDE,ZESTORETIC) 20-12.5 MG tablet Take 1 tablet by mouth daily.  Marland Kitchen olmesartan-hydrochlorothiazide (BENICAR HCT) 20-12.5 MG tablet Take 1 tablet by mouth daily.  . predniSONE (DELTASONE) 20 MG tablet 2 tabs po QD x 5 days, then 1 tab po QD x 5 days  . [DISCONTINUED] typhoid (VIVOTIF) DR capsule Take 1 capsule by mouth every other day.   No facility-administered encounter medications on file as of 05/17/2018.          Objective:   Physical Exam  Constitutional: She is oriented to person, place, and time. She appears well-developed and well-nourished.  HENT:  Head: Normocephalic and atraumatic.  Swollen upper and lower lid and over medial nasal bridge.    Cardiovascular: Normal rate, regular rhythm and normal heart sounds.  Pulmonary/Chest: Effort normal and breath sounds normal.  Neurological: She is alert and oriented to person, place, and time.  Skin: Skin is warm and dry.  Psychiatric: She has a normal mood and affect. Her behavior is normal.         Assessment & Plan:  Wasp sting -discussed options.  I think she would be a candidate candidate for oral prednisone as well as continuing to ice the area.  HTN - Well controlled. Will d/c ACEi.    Switch to an ARB.  Check home blood pressure in a few weeks and let me know if it seems to be well controlled.  We will continue with the diuretic.  Cough-possibly secondary to ACE inhibitor.  Will discontinue lisinopril.  Will change to an ARB.  Encouraged her to call me in a few weeks and let me know if she is actually feeling better if she is not then please let me know.

## 2018-05-17 NOTE — Patient Instructions (Signed)
Call in 3 weeks and let me know if cough is better and if home BP is OK.

## 2018-05-21 ENCOUNTER — Other Ambulatory Visit: Payer: Self-pay | Admitting: Family Medicine

## 2018-05-21 MED ORDER — TELMISARTAN-HCTZ 40-12.5 MG PO TABS: 1.0000 | ORAL_TABLET | Freq: Every day | ORAL | 1 refills | Status: DC

## 2018-05-21 NOTE — Progress Notes (Signed)
Omelsartan on back order.  [facility]  New prescription sent for telmisartan

## 2018-09-18 ENCOUNTER — Encounter: Payer: Self-pay | Admitting: Family Medicine

## 2018-09-18 ENCOUNTER — Ambulatory Visit (INDEPENDENT_AMBULATORY_CARE_PROVIDER_SITE_OTHER): Payer: Medicare Other

## 2018-09-18 ENCOUNTER — Ambulatory Visit (INDEPENDENT_AMBULATORY_CARE_PROVIDER_SITE_OTHER): Payer: Medicare Other | Admitting: Family Medicine

## 2018-09-18 VITALS — BP 117/55 | HR 84 | Ht 69.0 in | Wt 147.0 lb

## 2018-09-18 DIAGNOSIS — M79672 Pain in left foot: Secondary | ICD-10-CM

## 2018-09-18 DIAGNOSIS — M7732 Calcaneal spur, left foot: Secondary | ICD-10-CM

## 2018-09-18 DIAGNOSIS — R05 Cough: Secondary | ICD-10-CM | POA: Diagnosis not present

## 2018-09-18 DIAGNOSIS — R053 Chronic cough: Secondary | ICD-10-CM

## 2018-09-18 DIAGNOSIS — I10 Essential (primary) hypertension: Secondary | ICD-10-CM

## 2018-09-18 DIAGNOSIS — Z23 Encounter for immunization: Secondary | ICD-10-CM | POA: Diagnosis not present

## 2018-09-18 MED ORDER — OMEPRAZOLE 20 MG PO CPDR: 20.0000 mg | DELAYED_RELEASE_CAPSULE | Freq: Every day | ORAL | 0 refills | Status: AC

## 2018-09-18 NOTE — Progress Notes (Signed)
Subjective:    Patient ID: Carolyn Lane, female    DOB: October 23, 1951, 67 y.o.   MRN: [id number]  HPI 67 year old female comes in today with a couple of concerns.  She has had a dry hacking cough x 4 months.  In fact when I saw her in May for her blood pressure she complained about a dry tickling cough in her throat area.  The time I felt like it was coming from her ACE inhibitor so we switched her to an R.  She says the cough never really went away.  She denies any fevers chills or deep cough or productive sputum.  She says sometimes it bothers her at night.  She notices that she is clearing her throat more often.  Hypertension- Pt denies chest pain, SOB, dizziness, or heart palpitations.  Taking meds as directed w/o problems.  Denies medication side effects.    She aslso c/ of left heel pain.  Feels like she is standing on a rock.  She says it has been bothering her for really for a couple of months but in the last 2 weeks it has ramped up and its been bothering her daily.  She has not been donating to particular treatments.  No known injury or trauma to the heel.  Review of Systems  BP (!) 117/55   Pulse 84   Ht 5\' 9"  (1.753 m)   Wt 147 lb (66.7 kg)   SpO2 100%   BMI 21.71 kg/m     No Known Allergies  No past medical history on file.  Past Surgical History:  Procedure Laterality Date  . ABDOMINAL HYSTERECTOMY      Social History   Socioeconomic History  . Marital status: Married    Spouse name: Not on file  . Number of children: Not on file  . Years of education: Not on file  . Highest education level: Not on file  Occupational History  . Not on file  Social Needs  . Financial resource strain: Not on file  . Food insecurity:    Worry: Not on file    Inability: Not on file  . Transportation needs:    Medical: Not on file    Non-medical: Not on file  Tobacco Use  . Smoking status: Never Smoker  . Smokeless tobacco: Never Used  Substance and Sexual Activity  .  Alcohol use: Yes    Alcohol/week: 3.0 standard drinks    Types: 3 Glasses of wine per week    Comment: per day  . Drug use: No  . Sexual activity: Not on file    Comment: substitute teacher, MA degree, married, 2 children, walks occ for exercise, 1 caffeine a day.   Lifestyle  . Physical activity:    Days per week: Not on file    Minutes per session: Not on file  . Stress: Not on file  Relationships  . Social connections:    Talks on phone: Not on file    Gets together: Not on file    Attends religious service: Not on file    Active member of club or organization: Not on file    Attends meetings of clubs or organizations: Not on file    Relationship status: Not on file  . Intimate partner violence:    Fear of current or ex partner: Not on file    Emotionally abused: Not on file    Physically abused: Not on file    Forced sexual activity: Not on  file  Other Topics Concern  . Not on file  Social History Narrative  . Not on file    Family History  Problem Relation Age of Onset  . Alcohol abuse Brother   . Alcohol abuse Other   . Heart attack Mother   . Heart attack Unknown     Outpatient Encounter Medications as of 09/18/2018  Medication Sig  . telmisartan-hydrochlorothiazide (MICARDIS HCT) 40-12.5 MG tablet Take 1 tablet by mouth daily.  Marland Kitchen triamcinolone cream (KENALOG) 0.5 % Apply 1 application topically 2 (two) times daily as needed.  Marland Kitchen omeprazole (PRILOSEC) 20 MG capsule Take 1 capsule (20 mg total) by mouth at bedtime.  . [DISCONTINUED] predniSONE (DELTASONE) 20 MG tablet 2 tabs po QD x 5 days, then 1 tab po QD x 5 days   No facility-administered encounter medications on file as of 09/18/2018.             Objective:   Physical Exam  Constitutional: She is oriented to person, place, and time. She appears well-developed and well-nourished.  HENT:  Head: Normocephalic and atraumatic.  Right Ear: External ear normal.  Left Ear: External ear normal.  Neck: Neck  supple. No JVD present. No tracheal deviation present. No thyromegaly present.  Cardiovascular: Normal rate, regular rhythm and normal heart sounds.  Pulmonary/Chest: Effort normal and breath sounds normal.  Lymphadenopathy:    She has no cervical adenopathy.  Neurological: She is alert and oriented to person, place, and time.  Skin: Skin is warm and dry.  Psychiatric: She has a normal mood and affect. Her behavior is normal.        Assessment & Plan:  HTN - Well controlled. Continue current regimen. Follow up in  6 months.    Cough -could still be a side effect of the medication even with an arm.  There is a small percentage of patients who still have a persistent cough.  We discussed options including a trial of a PPI for 2 weeks initially and if her symptoms still do not resolve then the plan will be for [place] to change her blood pressure medication.  If at that point it still does not resolve they consider further work-up by ENT and may be even treatment for postnasal drip and/or allergies.  Heel pain-we will get x-ray for further work-up.  Suspect that she could have a heel spur or a bruise to the fat pad.  We discussed the treatment with could potentially include things like heel cups and and or may be an injection.  Will call with results once available.

## 2018-10-15 ENCOUNTER — Telehealth: Payer: Self-pay | Admitting: Family Medicine

## 2018-10-15 DIAGNOSIS — I999 Unspecified disorder of circulatory system: Secondary | ICD-10-CM

## 2018-10-15 NOTE — Telephone Encounter (Signed)
We received review from recent House Call.  The screening for periph vasc disease showed a possible abnormality on the left foot.  I would like to refer hr for ABI's. Special BPs of the extremities to work this up further.

## 2018-10-16 NOTE — Telephone Encounter (Signed)
Left VM for Pt to return clinic call.  

## 2018-10-19 NOTE — Telephone Encounter (Signed)
Order placed

## 2018-10-19 NOTE — Telephone Encounter (Signed)
Spoke w/pt and she would like to proceed with having this done.Laureen Ochs, Viann Shove

## 2018-10-30 IMAGING — DX DG CHEST 2V
2 series · 2 of 2 positions shown · non-contrast
Comparison: None.

CLINICAL DATA: Chronic cough

EXAM:
CHEST - 2 VIEW

[chest pa]
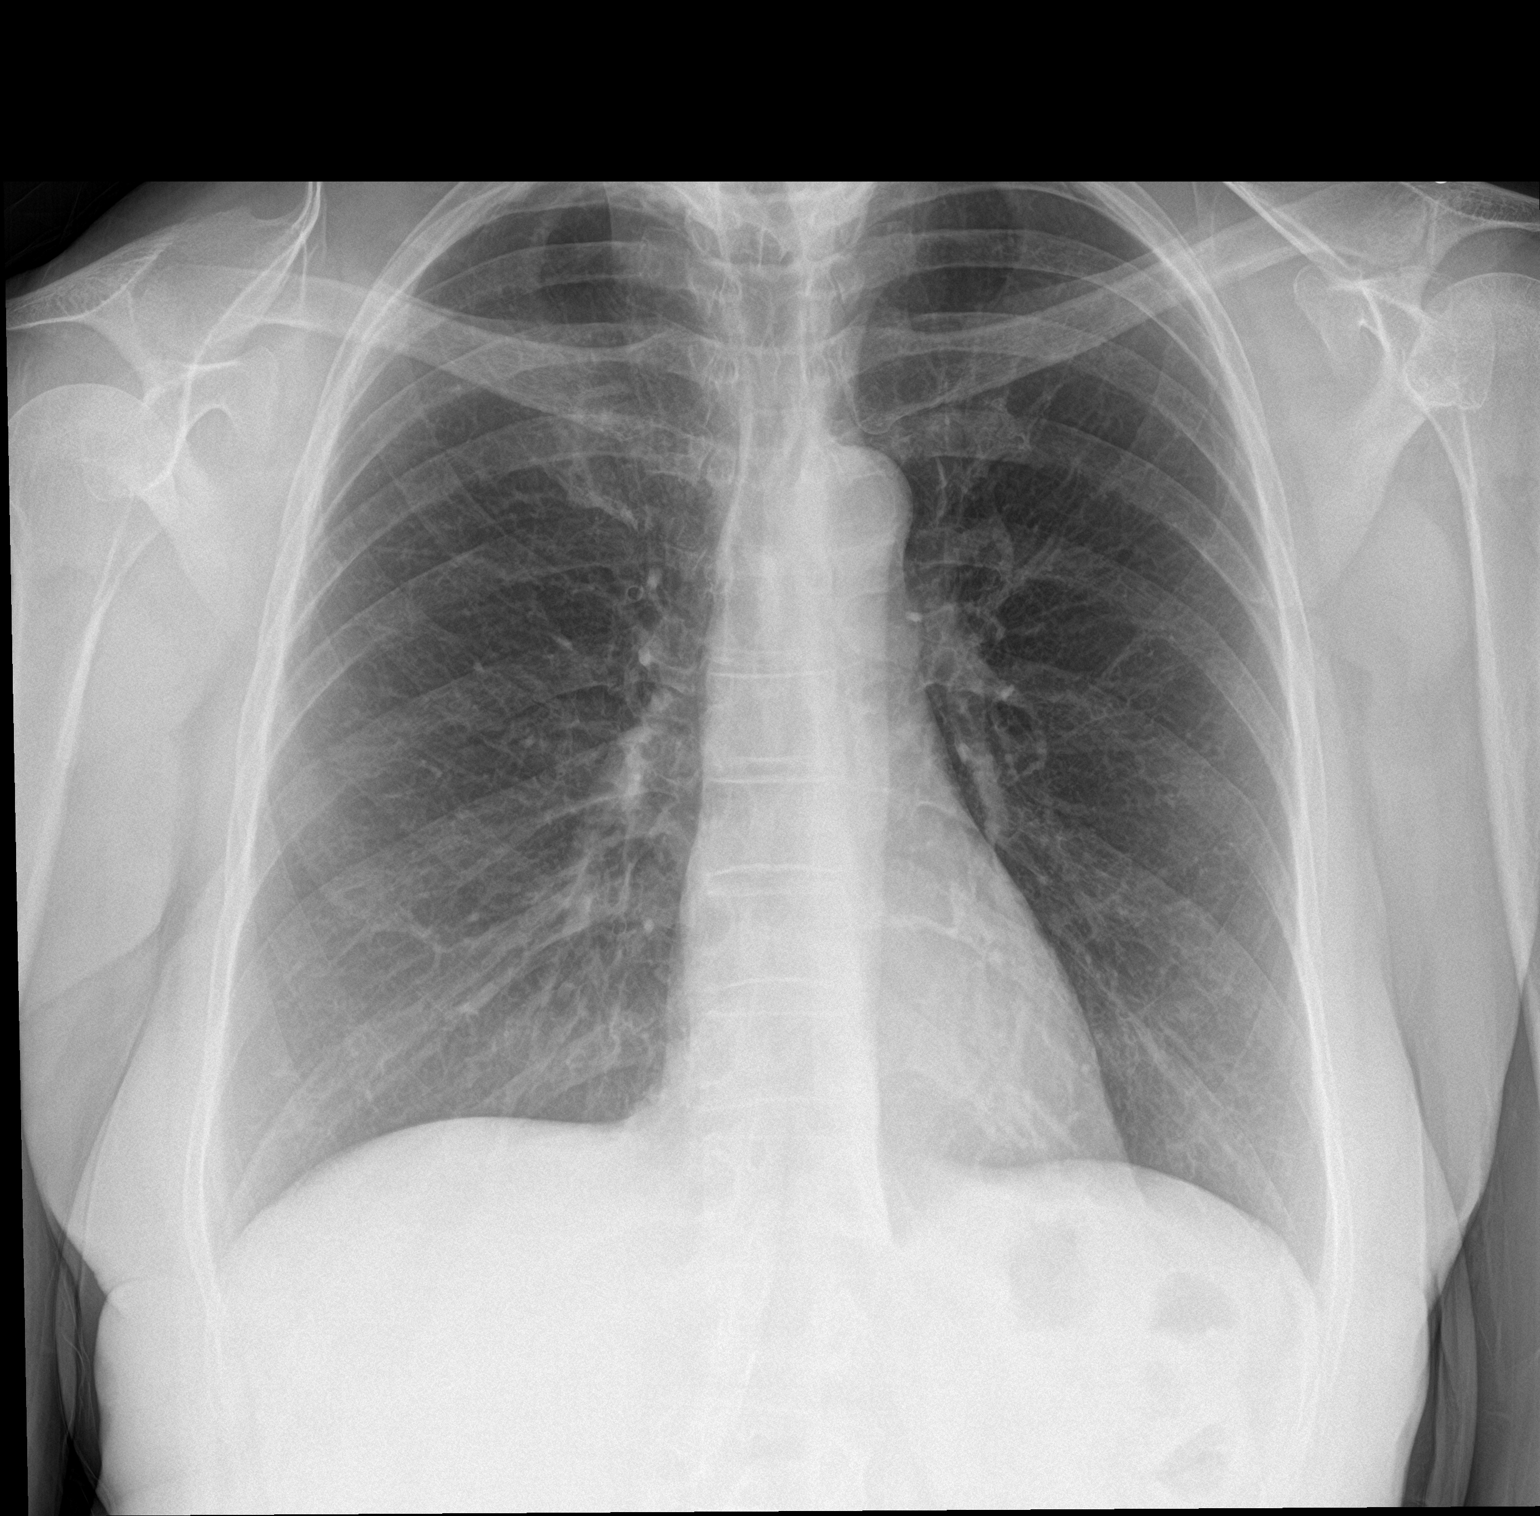

[chest lat]
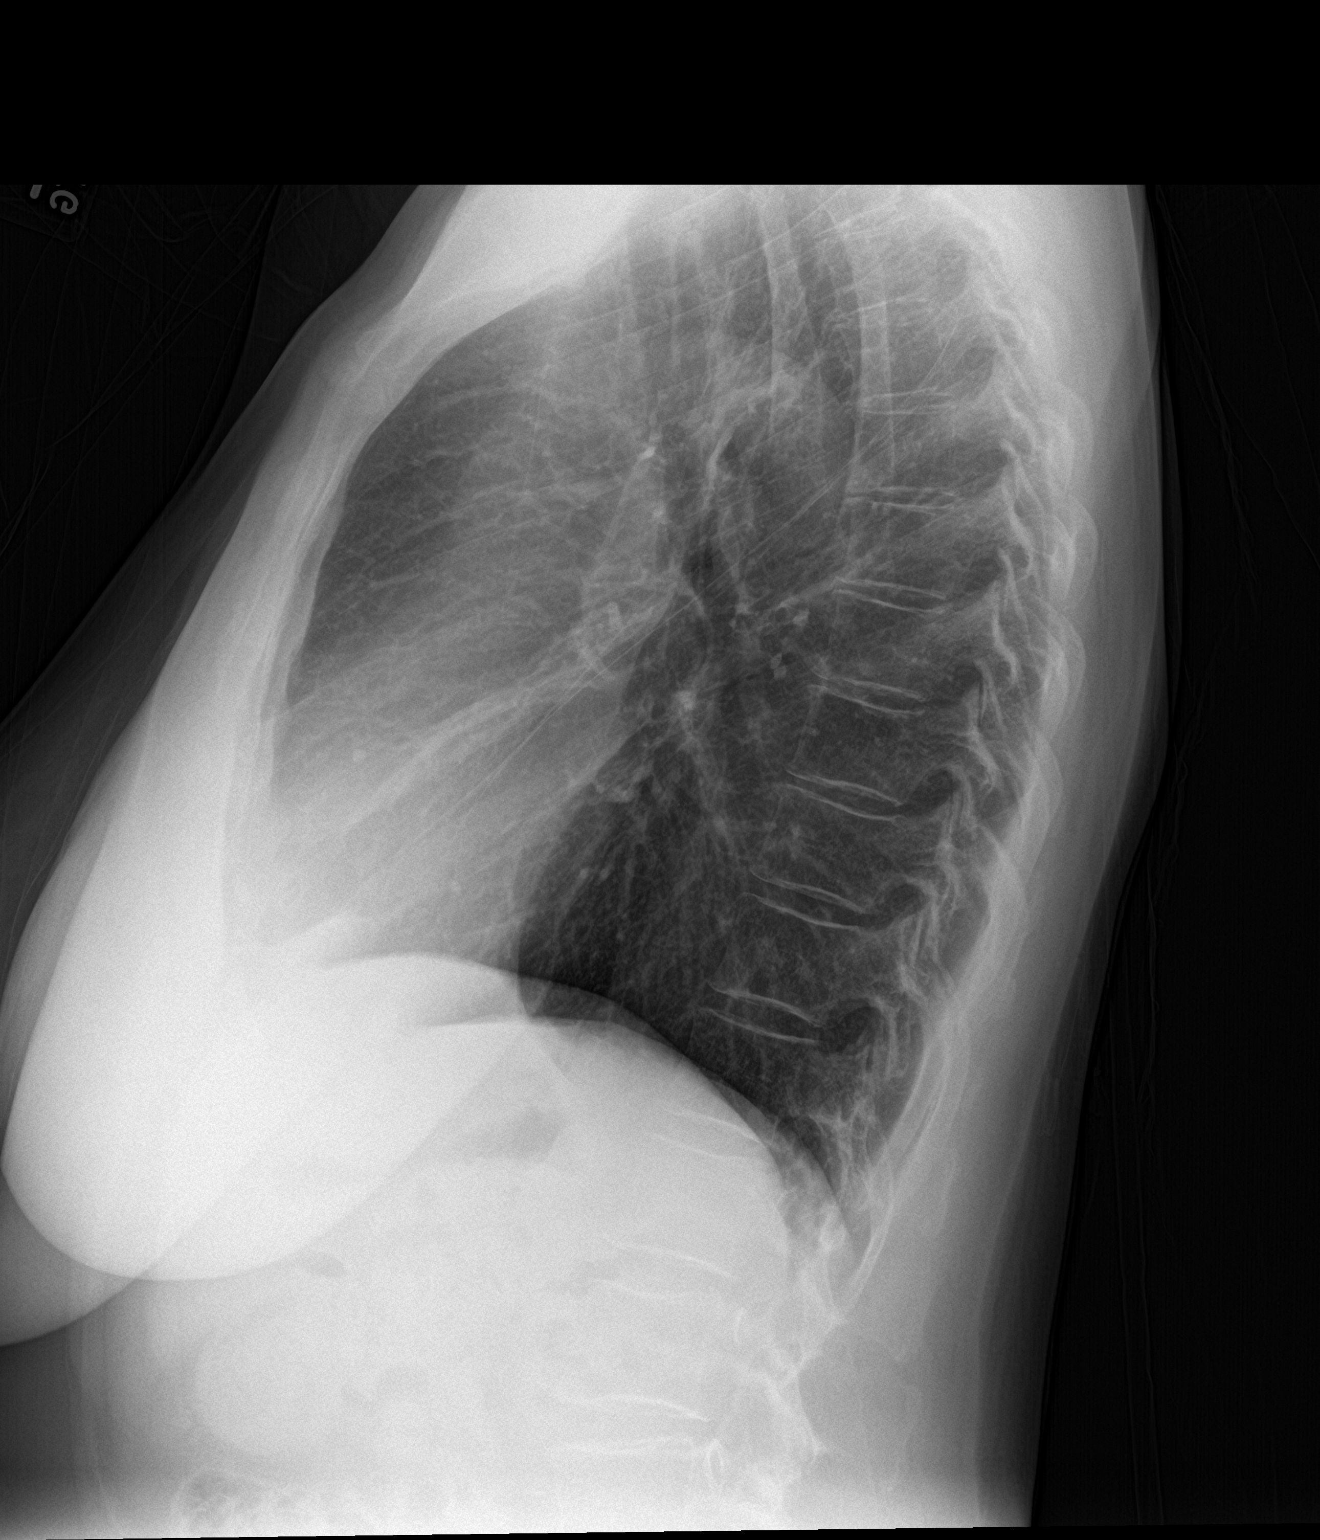

[2 of 2 positions shown; findings below may reference images not displayed]

FINDINGS: The heart size and mediastinal contours are within normal limits.
Both lungs are clear. The visualized skeletal structures are
unremarkable.
IMPRESSION: No active cardiopulmonary disease.

## 2018-11-02 ENCOUNTER — Ambulatory Visit (HOSPITAL_BASED_OUTPATIENT_CLINIC_OR_DEPARTMENT_OTHER)
Admission: RE | Admit: 2018-11-02 | Discharge: 2018-11-02 | Disposition: A | Discharge status: Home / Self Care | Payer: Medicare Other | Source: Ambulatory Visit | Attending: Family Medicine | Admitting: Family Medicine

## 2018-11-02 DIAGNOSIS — I999 Unspecified disorder of circulatory system: Secondary | ICD-10-CM

## 2018-11-02 NOTE — Progress Notes (Signed)
LOWER ARTERIAL ABI WITH TBI DOPPLER PERFORMED    Right: Resting right ankle-brachial index is within normal range. No evidence of significant right lower extremity arterial disease. The right toe-brachial index is normal.   Left: Resting left ankle-brachial index is within normal range. No evidence of significant left lower extremity arterial disease. The left toe-brachial index is normal.     11/02/18 Sinda DuJoseph Miquela Costabile RDCS, RVT

## 2018-11-19 ENCOUNTER — Other Ambulatory Visit: Payer: Self-pay

## 2018-11-19 MED ORDER — TELMISARTAN-HCTZ 40-12.5 MG PO TABS: 1.0000 | ORAL_TABLET | Freq: Every day | ORAL | 1 refills | Status: AC

## 2019-03-26 ENCOUNTER — Encounter: Payer: Self-pay | Admitting: Family Medicine

## 2019-03-27 MED ORDER — TERBINAFINE HCL 250 MG PO TABS: 250.0000 mg | ORAL_TABLET | Freq: Every day | ORAL | 1 refills | Status: AC

## 2019-03-27 NOTE — Telephone Encounter (Signed)
See if the aptient can try to attach pic again. I can't see it.
